# Patient Record
Sex: Male | Born: 1985 | Race: White | Hispanic: No | Marital: Single | Smoking: Current every day smoker
Health system: Southern US, Community
[De-identification: ages and names within clinical notes are randomized; demographics above are authoritative.]

## PROBLEM LIST (undated history)

## (undated) DIAGNOSIS — F419 Anxiety disorder, unspecified: Secondary | ICD-10-CM

## (undated) DIAGNOSIS — I959 Hypotension, unspecified: Secondary | ICD-10-CM

## (undated) DIAGNOSIS — G43909 Migraine, unspecified, not intractable, without status migrainosus: Secondary | ICD-10-CM

## (undated) HISTORY — PX: ABDOMINAL SURGERY: SHX537

---

## 2009-01-30 ENCOUNTER — Ambulatory Visit: Payer: Self-pay | Admitting: Diagnostic Radiology

## 2009-01-30 ENCOUNTER — Emergency Department (HOSPITAL_BASED_OUTPATIENT_CLINIC_OR_DEPARTMENT_OTHER): Admission: EM | Admit: 2009-01-30 | Discharge: 2009-01-30 | Payer: Self-pay | Admitting: Emergency Medicine

## 2010-12-21 ENCOUNTER — Emergency Department (HOSPITAL_BASED_OUTPATIENT_CLINIC_OR_DEPARTMENT_OTHER)
Admission: EM | Admit: 2010-12-21 | Discharge: 2010-12-21 | Disposition: A | Discharge status: Home / Self Care | Payer: Self-pay | Attending: Emergency Medicine | Admitting: Emergency Medicine

## 2010-12-21 ENCOUNTER — Emergency Department (HOSPITAL_BASED_OUTPATIENT_CLINIC_OR_DEPARTMENT_OTHER): Payer: Self-pay

## 2010-12-21 ENCOUNTER — Emergency Department (INDEPENDENT_AMBULATORY_CARE_PROVIDER_SITE_OTHER): Payer: Self-pay

## 2010-12-21 DIAGNOSIS — S022XXA Fracture of nasal bones, initial encounter for closed fracture: Secondary | ICD-10-CM

## 2010-12-21 DIAGNOSIS — H5789 Other specified disorders of eye and adnexa: Secondary | ICD-10-CM | POA: Insufficient documentation

## 2010-12-21 DIAGNOSIS — J3489 Other specified disorders of nose and nasal sinuses: Secondary | ICD-10-CM

## 2010-12-21 DIAGNOSIS — R599 Enlarged lymph nodes, unspecified: Secondary | ICD-10-CM

## 2010-12-21 DIAGNOSIS — R51 Headache: Secondary | ICD-10-CM

## 2010-12-21 MED ORDER — PREDNISONE 10 MG PO TABS: 20.0000 mg | ORAL_TABLET | Freq: Every day | ORAL | Status: AC

## 2010-12-21 MED ORDER — OXYMETAZOLINE HCL 0.05 % NA SOLN: 1.0000 | Freq: Once | NASAL | Status: DC

## 2010-12-21 MED ORDER — OXYMETAZOLINE HCL 0.05 % NA SOLN
NASAL | Status: AC
  Filled 2010-12-21: qty 15

## 2010-12-21 MED ORDER — HYDROMORPHONE HCL 2 MG/ML IJ SOLN
2.0000 mg | Freq: Once | INTRAMUSCULAR | Status: AC
  Administered 2010-12-21: 2 mg via INTRAMUSCULAR

## 2010-12-21 MED ORDER — OXYCODONE-ACETAMINOPHEN 5-325 MG PO TABS: 2.0000 | ORAL_TABLET | ORAL | Status: AC | PRN

## 2010-12-21 MED ORDER — HYDROMORPHONE HCL 2 MG/ML IJ SOLN
INTRAMUSCULAR | Status: AC
  Administered 2010-12-21: 2 mg via INTRAMUSCULAR
  Filled 2010-12-21: qty 1
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
## 2012-04-30 ENCOUNTER — Emergency Department (HOSPITAL_BASED_OUTPATIENT_CLINIC_OR_DEPARTMENT_OTHER)
Admission: EM | Admit: 2012-04-30 | Discharge: 2012-04-30 | Disposition: A | Discharge status: Home / Self Care | Payer: Self-pay | Attending: Emergency Medicine | Admitting: Emergency Medicine

## 2012-04-30 ENCOUNTER — Encounter (HOSPITAL_BASED_OUTPATIENT_CLINIC_OR_DEPARTMENT_OTHER): Payer: Self-pay | Admitting: *Deleted

## 2012-04-30 DIAGNOSIS — Z8669 Personal history of other diseases of the nervous system and sense organs: Secondary | ICD-10-CM | POA: Insufficient documentation

## 2012-04-30 DIAGNOSIS — I959 Hypotension, unspecified: Secondary | ICD-10-CM | POA: Insufficient documentation

## 2012-04-30 DIAGNOSIS — F17200 Nicotine dependence, unspecified, uncomplicated: Secondary | ICD-10-CM | POA: Insufficient documentation

## 2012-04-30 DIAGNOSIS — Z79899 Other long term (current) drug therapy: Secondary | ICD-10-CM | POA: Insufficient documentation

## 2012-04-30 DIAGNOSIS — Z8679 Personal history of other diseases of the circulatory system: Secondary | ICD-10-CM | POA: Insufficient documentation

## 2012-04-30 DIAGNOSIS — K0889 Other specified disorders of teeth and supporting structures: Secondary | ICD-10-CM

## 2012-04-30 DIAGNOSIS — K089 Disorder of teeth and supporting structures, unspecified: Secondary | ICD-10-CM | POA: Insufficient documentation

## 2012-04-30 DIAGNOSIS — R51 Headache: Secondary | ICD-10-CM

## 2012-04-30 HISTORY — DX: Migraine, unspecified, not intractable, without status migrainosus: G43.909

## 2012-04-30 HISTORY — DX: Hypotension, unspecified: I95.9

## 2012-04-30 MED ORDER — MORPHINE SULFATE 4 MG/ML IJ SOLN
8.0000 mg | Freq: Once | INTRAMUSCULAR | Status: AC
  Administered 2012-04-30: 8 mg via INTRAVENOUS
  Filled 2012-04-30: qty 2

## 2012-04-30 MED ORDER — PROMETHAZINE HCL 25 MG/ML IJ SOLN
25.0000 mg | Freq: Once | INTRAMUSCULAR | Status: AC
  Administered 2012-04-30: 25 mg via INTRAMUSCULAR
  Filled 2012-04-30: qty 1

## 2012-04-30 MED ORDER — PENICILLIN V POTASSIUM 500 MG PO TABS: 500.0000 mg | ORAL_TABLET | Freq: Three times a day (TID) | ORAL | Status: DC

## 2012-04-30 MED ORDER — MORPHINE SULFATE 10 MG/ML IJ SOLN: 10.0000 mg | Freq: Once | INTRAMUSCULAR | Status: DC

## 2012-04-30 NOTE — ED Notes (Signed)
Pt states that he now feels well enough to go home and wishes to be discharged at this time.  Provider notified.

## 2012-04-30 NOTE — ED Notes (Addendum)
Migraine x 3 days. Pupils 2 mm.
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
## 2016-05-25 ENCOUNTER — Encounter (HOSPITAL_COMMUNITY): Payer: Self-pay

## 2016-05-25 ENCOUNTER — Emergency Department (HOSPITAL_COMMUNITY): Payer: Self-pay

## 2016-05-25 ENCOUNTER — Emergency Department (HOSPITAL_COMMUNITY)
Admission: EM | Admit: 2016-05-25 | Discharge: 2016-05-25 | Disposition: A | Discharge status: Home / Self Care | Payer: Self-pay | Attending: Emergency Medicine | Admitting: Emergency Medicine

## 2016-05-25 DIAGNOSIS — F1910 Other psychoactive substance abuse, uncomplicated: Secondary | ICD-10-CM | POA: Insufficient documentation

## 2016-05-25 DIAGNOSIS — Z79899 Other long term (current) drug therapy: Secondary | ICD-10-CM | POA: Insufficient documentation

## 2016-05-25 DIAGNOSIS — Z7982 Long term (current) use of aspirin: Secondary | ICD-10-CM | POA: Insufficient documentation

## 2016-05-25 DIAGNOSIS — F17200 Nicotine dependence, unspecified, uncomplicated: Secondary | ICD-10-CM | POA: Insufficient documentation

## 2016-05-25 HISTORY — DX: Anxiety disorder, unspecified: F41.9

## 2016-05-25 LAB — CBC
HEMATOCRIT: 48.2 % (ref 39.0–52.0)
HEMOGLOBIN: 16.6 g/dL (ref 13.0–17.0)
MCH: 29.6 pg (ref 26.0–34.0)
MCHC: 34.4 g/dL (ref 30.0–36.0)
MCV: 85.9 fL (ref 78.0–100.0)
Platelets: 390 10*3/uL (ref 150–400)
RBC: 5.61 MIL/uL (ref 4.22–5.81)
RDW: 13.4 % (ref 11.5–15.5)
WBC: 11.8 10*3/uL — AB (ref 4.0–10.5)

## 2016-05-25 LAB — BASIC METABOLIC PANEL
ANION GAP: 15 (ref 5–15)
BUN: 17 mg/dL (ref 6–20)
CO2: 22 mmol/L (ref 22–32)
Calcium: 10.3 mg/dL (ref 8.9–10.3)
Chloride: 101 mmol/L (ref 101–111)
Creatinine, Ser: 1 mg/dL (ref 0.61–1.24)
Glucose, Bld: 112 mg/dL — ABNORMAL HIGH (ref 65–99)
POTASSIUM: 3.8 mmol/L (ref 3.5–5.1)
SODIUM: 138 mmol/L (ref 135–145)

## 2016-05-25 LAB — I-STAT TROPONIN, ED: Troponin i, poc: 0 ng/mL (ref 0.00–0.08)

## 2016-05-25 LAB — TROPONIN I

## 2016-05-25 LAB — CK: CK TOTAL: 656 U/L — AB (ref 49–397)

## 2016-05-25 MED ORDER — LORAZEPAM 2 MG/ML IJ SOLN
2.0000 mg | Freq: Once | INTRAMUSCULAR | Status: AC
  Administered 2016-05-25: 2 mg via INTRAVENOUS
  Filled 2016-05-25: qty 1

## 2016-05-25 MED ORDER — SODIUM CHLORIDE 0.9 % IV BOLUS (SEPSIS)
1000.0000 mL | Freq: Once | INTRAVENOUS | Status: AC
  Administered 2016-05-25: 1000 mL via INTRAVENOUS

## 2016-05-25 NOTE — ED Notes (Signed)
Patient is in the room got patient into gown waiting on provider

## 2016-05-25 NOTE — ED Notes (Signed)
EDP at bedside  

## 2016-05-25 NOTE — ED Provider Notes (Signed)
[facility]-EMERGENCY DEPT Provider Note   CSN: [id number] Arrival date & time: 05/25/16  1240     History   Chief Complaint Chief Complaint  Patient presents with  . shortness of breath/ skin tingling    HPI Ronnie Moran is a 31 y.o. male.  HPI 31 year old male presents after [facility]. Patient states that earlier today he used speed and injected it into the "wrong place". He states that since then he developed some trouble breathing and felt anxious. He thinks that his feet look different and his friend states that he was flushed. Patient states that at the outside hospital he had his medical screening exam done and was discharged and told to follow-up with a "cardiology therapist." He states he is concerned he has something "really going on" and that he will be able to see the specialist in 30 days. He states he was in his usual state of health prior to taking he speed. He took his Ativan and Wellbutrin prior to arrival but his symptoms continue. He denies any fevers. Does say he has a mild headache. History is limited as patient is rambling and appears intoxicated.  Past Medical History:  Diagnosis Date  . Anxiety   . Hypotension   . Migraines     There are no active problems to display for this patient.   Past Surgical History:  Procedure Laterality Date  . ABDOMINAL SURGERY         Home Medications    Prior to Admission medications   Medication Sig Start Date End Date Taking? Authorizing Provider  Cyanocobalamin (VITAMIN B 12 PO) Take 1 tablet by mouth daily.   Yes Historical Provider, MD  LORazepam (ATIVAN) 1 MG tablet Take 1 tablet by mouth every 6 (six) hours as needed for anxiety.   Yes Historical Provider, MD  Multiple Vitamins-Minerals (MULTIVITAMIN WITH MINERALS) tablet Take 1 tablet by mouth daily.   Yes Historical Provider, MD  Safflower Oil (TONALIN CLA) 1000 MG CAPS Take 1,000 mg by mouth daily.   Yes Historical  Provider, MD  vitamin E (VITAMIN E) 200 UNIT capsule Take 200 Units by mouth daily.   Yes Historical Provider, MD  ALPRAZolam Prudy Feeler(XANAX) 1 MG tablet Take 1 mg by mouth 3 (three) times daily.     Historical Provider, MD  aspirin-acetaminophen-caffeine (EXCEDRIN MIGRAINE) [phone] MG per tablet Take 2 tablets by mouth every 6 (six) hours as needed. pain     Historical Provider, MD  oxyCODONE-acetaminophen (PERCOCET) 5-325 MG per tablet Take 1 tablet by mouth every 4 (four) hours as needed. pain     Historical Provider, MD  penicillin v potassium (VEETID) 500 MG tablet Take 1 tablet (500 mg total) by mouth 3 (three) times daily. Patient not taking: Reported on 05/25/2016 04/30/12   Geoffery Lyonsouglas Delo, MD    Family History No family history on file.  Social History Social History  Substance Use Topics  . Smoking status: Current Every Day Smoker  . Smokeless tobacco: Never Used  . Alcohol use No     Allergies   Ceclor [cefaclor]   Review of Systems Review of Systems  Unable to perform ROS: Mental status change  Constitutional: Negative for fever.  Allergic/Immunologic: Negative for immunocompromised state.     Physical Exam Updated Vital Signs BP (!) 138/104   Pulse 98   Temp 98.4 F (36.9 C)   Resp 16   SpO2 100%   Physical Exam  Constitutional: He is oriented to  person, place, and time. He appears well-developed and well-nourished. No distress.  HENT:  Head: Normocephalic and atraumatic.  Left Ear: External ear normal.  Eyes: Conjunctivae are normal. Pupils are equal, round, and reactive to light. Right eye exhibits no discharge. Left eye exhibits no discharge.  Neck: Normal range of motion. Neck supple.  Cardiovascular: Regular rhythm and intact distal pulses.  Exam reveals no gallop and no friction rub.   No murmur heard. Tachy to 140s  Pulmonary/Chest: Effort normal and breath sounds normal. No respiratory distress.  Abdominal: Soft. Bowel sounds are normal. He exhibits no  distension and no mass. There is no tenderness. There is no rebound and no guarding.  Musculoskeletal: Normal range of motion. He exhibits no edema, tenderness or deformity.  Neurological: He is alert and oriented to person, place, and time. No cranial nerve deficit.  Skin: Skin is warm. He is not diaphoretic.  Psychiatric:  Appears intoxicated, rambling, states his hands and feet look red but they do not     ED Treatments / Results  Labs (all labs ordered are listed, but only abnormal results are displayed) Labs Reviewed  BASIC METABOLIC PANEL - Abnormal; Notable for the following:       Result Value   Glucose, Bld 112 (*)    All other components within normal limits  CBC - Abnormal; Notable for the following:    WBC 11.8 (*)    All other components within normal limits  CK - Abnormal; Notable for the following:    Total CK 656 (*)    All other components within normal limits  TROPONIN I  I-STAT TROPOININ, ED    EKG  EKG Interpretation None       Radiology Dg Chest 2 View  Result Date: 05/25/2016 CLINICAL DATA:  Shortness of breath and nausea over the last day. Smoking history. EXAM: CHEST  2 VIEW COMPARISON:  None. FINDINGS: Heart size is normal. Mediastinal shadows are normal. The lungs are clear except for mild linear scarring in the anterior right upper lobe. No infiltrate, collapse or effusion. Bony structures are unremarkable. IMPRESSION: No active disease. Mild linear scarring in the anterior right upper lobe. Electronically Signed   By: Paulina Fusi M.D.   On: 05/25/2016 13:22    Procedures Procedures (including critical care time)  Medications Ordered in ED Medications  sodium chloride 0.9 % bolus 1,000 mL (0 mLs Intravenous Stopped 05/25/16 1658)  LORazepam (ATIVAN) injection 2 mg (2 mg Intravenous Given 05/25/16 1524)     Initial Impression / Assessment and Plan / ED Course  I have reviewed the triage vital signs and the nursing notes.  Pertinent labs &  imaging results that were available during my care of the patient were reviewed by me and considered in my medical decision making (see chart for details).     Likely drug-related intoxication. Patient exam is reassuring. No red signs on exam or on history for life-threatening emergency.  Labs reviewed - CK slightly increased but s/p fluids, not significantly elevated to cause rhabdo at this time and pt feeling better, Cr wnls. Mild leukocytosis but AF - do not suspect bacteremia but will need to fu closely. CXR negative. IV site does not appear infected. Pt advised he must closely fu with pcp and seek care for addiction. Strict return precautions. EKG with nonspecific changes from demand ischemia, trop neg. VS improved after fluids/ativan. No SI/HI.  Final Clinical Impressions(s) / ED Diagnoses   Final diagnoses:  Drug abuse  New Prescriptions Discharge Medication List as of 05/25/2016  4:58 PM       Sidney Ace, MD 05/25/16 2130    Doug Sou, MD 05/26/16 [id number]

## 2016-05-25 NOTE — ED Triage Notes (Signed)
Patient here with tachycardia and shortness of breath for 12 hours. States that he is having tingling in extremities, states that he injected speed several hours ago. Alert and oriented. No chest pain, appears anxious. Took his ativan and wellbutrin pta

## 2016-05-25 NOTE — ED Notes (Signed)
Attempted to add on CK lab. Main lab not picking up. Will attempt again.
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
## 2016-05-25 NOTE — ED Provider Notes (Signed)
MSE was initiated and I personally evaluated the patient and placed orders (if any) at  3:07 PM on May 25, 2016.  Patient presents to the ED with a chief complaint of methamphetamine intoxication.  Used meth about 3 hours ago.  Reports racing heart and tingling/hypersenstivity of skin all over.    Will monitor, give fluids, and ativan.  Labs are reassuring.  PE: Gen: A&O x4 HEENT: PERRL, EOM intact CHEST: tachycardic, no m/r/g LUNGS: CTAB, no w/r/r ABD: BS x 4, ND/NT EXT: No edema, strong peripheral pulses NEURO: Sensation and strength intact bilaterally SKIN: warm and dry, no rash or cellulitis, no apparent infection of injection site.   The patient appears stable so that the remainder of the MSE may be completed by another provider.   Roxy Horsemanobert Taggart Prasad, PA-C 05/25/16 1511    Azalia BilisKevin Campos, MD 05/26/16 [phone]

## 2016-09-08 ENCOUNTER — Other Ambulatory Visit: Payer: Self-pay | Admitting: Pharmacist Clinician (PhC)/ Clinical Pharmacy Specialist

## 2016-09-08 ENCOUNTER — Ambulatory Visit (INDEPENDENT_AMBULATORY_CARE_PROVIDER_SITE_OTHER): Payer: Self-pay | Admitting: Internal Medicine

## 2016-09-08 ENCOUNTER — Encounter: Payer: Self-pay | Admitting: Internal Medicine

## 2016-09-08 DIAGNOSIS — B182 Chronic viral hepatitis C: Secondary | ICD-10-CM

## 2016-09-08 MED ORDER — SOFOSBUVIR-VELPATASVIR 400-100 MG PO TABS: 1.0000 | ORAL_TABLET | Freq: Every day | ORAL | 2 refills | Status: DC

## 2016-09-08 NOTE — Progress Notes (Unsigned)
HPI: Ronnie Moran is a 31 y.o. male who is here for an evaluation of his hep C.   No results found for: HCVGENOTYPE, HEPCGENOTYPE  Allergies: Allergies  Allergen Reactions  . Ceclor [Cefaclor] Rash    Vitals: Temp: 98 F (36.7 C) (05/30 1005) Temp Source: Oral (05/30 1005) BP: 131/84 (05/30 1005) Pulse Rate: 74 (05/30 1005)  Past Medical History: Past Medical History:  Diagnosis Date  . Anxiety   . Hypotension   . Migraines     Social History: Social History   Social History  . Marital status: Single    Spouse name: N/A  . Number of children: N/A  . Years of education: N/A   Social History Main Topics  . Smoking status: Current Every Day Smoker    Packs/day: 0.50    Types: Cigarettes    Start date: 04/12/1997  . Smokeless tobacco: Never Used  . Alcohol use No  . Drug use: Yes    Types: Marijuana     Comment: socially  . Sexual activity: No   Other Topics Concern  . Not on file   Social History Narrative  . No narrative on file    Labs: No results found for: HIV1RNAQUANT, HIV1RNAVL, CD4TABS, HEPBSAB, HEPBSAG, HCVAB  No results found for: HCVGENOTYPE, HEPCGENOTYPE  No flowsheet data found.  No results found for: AST, ALT, INR  CrCl: CrCl cannot be calculated (Patient's most recent lab result is older than the maximum 21 days allowed.).  Fibrosis Score: None  Child-Pugh Score: Prob A  Previous Treatment Regimen: None  Assessment: Ronnie Moran is here for his hep C evaluation. 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 He [place] positive [place]. In the process of doing patient assistance for Epclusa, we talked to him about the [facility] study coming later this month. We brought Kim in to talk to him about the study. He would be a great study candidate because he would be a very adherent candidate. His meds and labs will be covered and compensation. Kim believe that it'll probably launch at the end of  June or July. He is willing to wait for the study. Patient assistance is always going to be an option for him.   Recommendations:  Epclusa x3 month either through [employer] or [employer] study  Norris CityMinh Ryen Heitmeyer, [place].D., BCPS, AAHIVP Clinical Infectious Disease Pharmacist [facility] 09/08/2016, 11:29 AM

## 2016-09-08 NOTE — Progress Notes (Signed)
[facility]   CC: consideration for treatment for chronic hepatitis C  HPI:  +Ronnie Moran is a 31 y.o. male who presents for initial evaluation and management of chronic hepatitis C.  Patient tested positive last year. Hepatitis C-associated risk factors present are: history of blood transfusion (details: possible transfusion around 1990 during surgery), sexual contact with person with liver disease (details: previous partner may have been positive). Patient denies IV drug abuse, renal dialysis. Patient has had other studies performed. Results: hepatitis C RNA by PCR, result: positive. Patient has not had prior treatment for Hepatitis C. Patient does not have a past history of liver disease. Patient does not have a family history of liver disease. Patient does not  have associated signs or symptoms related to liver disease.  Labs reviewed and confirm chronic hepatitis C with a positive viral load.  Also I have reviewed his previous record from [facility] and he has genotype 2, hepatitis B surface Ab negative, HIV negative.  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  He also is interested in PrEP and did get prescribed by his PCP but cannot afford the medication.       Patient does not have documented immunity to Hepatitis A. Patient does not have documented immunity to Hepatitis B.    Review of Systems:  Constitutional: negative for fatigue and malaise Gastrointestinal: negative for diarrhea Integument/breast: negative for rash Musculoskeletal: negative for myalgias and arthralgias All other systems reviewed and are negative       Past Medical History:  Diagnosis Date  . Anxiety   . Hypotension   . Migraines     Prior to Admission medications   Medication Sig Start Date End Date Taking? Authorizing Provider  aspirin-acetaminophen-caffeine (EXCEDRIN MIGRAINE) [phone] MG per  tablet Take 2 tablets by mouth every 6 (six) hours as needed. pain    Yes [provider]  clonazePAM (KLONOPIN) 1 MG tablet Take 1 mg by mouth 2 (two) times daily.   Yes [provider]  Cyanocobalamin (VITAMIN B 12 PO) Take 1 tablet by mouth daily.   Yes [provider]  gabapentin (NEURONTIN) 300 MG capsule Take 600 mg by mouth 2 (two) times daily.   Yes [provider]  ALPRAZolam Prudy Feeler) 1 MG tablet Take 1 mg by mouth 3 (three) times daily.     [provider]  Multiple Vitamins-Minerals (MULTIVITAMIN WITH MINERALS) tablet Take 1 tablet by mouth daily.    [provider]  Sofosbuvir-Velpatasvir (EPCLUSA) 400-100 MG TABS Take 1 tablet by mouth daily. 09/08/16   Gardiner Barefoot, MD  vitamin E (VITAMIN E) 200 UNIT capsule Take 200 Units by mouth daily.    [provider]    Allergies  Allergen Reactions  . Ceclor [Cefaclor] Rash    Social History  Substance Use Topics  . Smoking status: Current Every Day Smoker    Packs/day: 0.50    Types: Cigarettes    Start date: 04/12/1997  . Smokeless tobacco: Never Used  . Alcohol use No    FMH: no renal disease, no cirrhosis or liver cancer   Objective:  Constitutional: in no apparent distress,  Vitals:   09/08/16 1005  BP: 131/84  Pulse: 74  Temp: 98 F (36.7 C)   Eyes: anicteric Cardiovascular: Cor RRR Respiratory: CTA B; normal respiratory effort Gastrointestinal: Bowel sounds are normal, liver is not enlarged,  spleen is not enlarged Musculoskeletal: no pedal edema noted Skin: negatives: no rash; no porphyria cutanea tarda Lymphatic: no cervical lymphadenopathy   Laboratory Genotype: No results found for: HCVGENOTYPE HCV viral load: No results found for: HCVQUANT Lab Results  Component Value Date   WBC 11.8 (H) 05/25/2016   HGB 16.6 05/25/2016   HCT 48.2 05/25/2016   MCV 85.9 05/25/2016   PLT 390 05/25/2016    Lab Results  Component Value Date   CREATININE  1.00 05/25/2016   BUN 17 05/25/2016   NA 138 05/25/2016   K 3.8 05/25/2016   CL 101 05/25/2016   CO2 22 05/25/2016   No results found for: ALT, AST, GGT, ALKPHOS   Labs and history reviewed and show CHILD-PUGH unknown  5-6 points: Child class A 7-9 points: Child class B 10-15 points: Child class C  No results found for: INR, BILITOT, ALBUMIN   Assessment: New Patient with Chronic Hepatitis C genotype unknown, untreated.  I discussed with the patient the lab findings that confirm chronic hepatitis C as well as the natural history and progression of disease including about 30% of people who develop cirrhosis of the liver if left untreated and once cirrhosis is established there is a 2-7% risk per year of liver cancer and liver failure.  I discussed the importance of treatment and benefits in reducing the risk, even if significant liver fibrosis exists.   Plan: 1) Patient counseled extensively on limiting acetaminophen to no more than 2 grams daily, avoidance of alcohol. 2) Transmission discussed with patient including sexual transmission, sharing razors and toothbrush.   3) Will need referral to gastroenterology if concern for cirrhosis 4) Will need referral for substance abuse counseling: No.; Further work up to include urine drug screen  No. 5) Will prescribe appropriate medication based on genotype and coverage  6) Hepatitis A and B vaccine but will defer for now to limit costs. 7) Pneumovax vaccine at some point  We discussed the options for treatment and he will consider our hepatitis C study that is upcoming, though no start date yet available.  If it is prolonged, will just treat him outside of the study and limit labs.  Medication will be Epclusa through [employer].  For PrEP, he was seen by our pharmacist and we can provide the medication through patient assistance and will consider this as well.

## 2016-09-09 NOTE — Addendum Note (Signed)
Addended by: Mariea ClontsGREEN, Maniya Donovan D on: 09/09/2016 10:38 AM   Modules accepted: Orders

## 2016-09-13 ENCOUNTER — Telehealth: Payer: Self-pay | Admitting: *Deleted

## 2016-09-13 NOTE — Telephone Encounter (Signed)
Patient called with questions about Hep C meds. He wanted to know if we had heard anything of the hep C study. Spoke to BJ's WholesaleKim Epperson and she said that she already explained to patient that it would be some time before we know about study. Spoke to EcolabBetty, pharm tech, and she said she also has spoken to the patient in great length regarding hep C meds. She said that his meds are approved if he does not want to wait for the study. Kathie RhodesBetty will call him back later this afternoon.

## 2016-09-15 NOTE — Telephone Encounter (Signed)
I called him back that day.  He was wanting to know if the study was approved.  I reminded him that it is estimated to be approved, maybe, the end of July.  He was satisfied with that and wants to wait.

## 2016-09-29 ENCOUNTER — Telehealth: Payer: Self-pay | Admitting: *Deleted

## 2016-09-29 NOTE — Telephone Encounter (Signed)
Patient has decided that he would rather start treatment now instead of waiting for the study to start. Will route to pharmacy and Dr Luciana Axeomer. Andree CossHowell, Ludger Bones M, RN

## 2016-09-30 ENCOUNTER — Other Ambulatory Visit: Payer: Self-pay | Admitting: Internal Medicine

## 2016-09-30 DIAGNOSIS — B182 Chronic viral hepatitis C: Secondary | ICD-10-CM

## 2016-09-30 NOTE — Telephone Encounter (Signed)
Ok, I put in for the labs he will need and needs elastography.  Then can get him on treatment. thanks

## 2016-10-05 NOTE — Telephone Encounter (Signed)
Kathie RhodesBetty will reach out to the patient for further instructions. Will 'cc Annice PihJackie as well.

## 2016-10-07 NOTE — Telephone Encounter (Signed)
I spoke extensively with Mr. Hancock-Lathrop.  He has decided he wants to be a part of the [place] study.  I will call [employer] and cancel his application.

## 2016-10-18 ENCOUNTER — Encounter (HOSPITAL_COMMUNITY): Payer: Self-pay | Admitting: Emergency Medicine

## 2016-10-18 ENCOUNTER — Emergency Department (HOSPITAL_COMMUNITY)
Admission: EM | Admit: 2016-10-18 | Discharge: 2016-10-19 | Disposition: A | Discharge status: Home / Self Care | Payer: Self-pay | Attending: Emergency Medicine | Admitting: Emergency Medicine

## 2016-10-18 DIAGNOSIS — R45851 Suicidal ideations: Secondary | ICD-10-CM | POA: Insufficient documentation

## 2016-10-18 DIAGNOSIS — Z046 Encounter for general psychiatric examination, requested by authority: Secondary | ICD-10-CM | POA: Insufficient documentation

## 2016-10-18 DIAGNOSIS — Z79899 Other long term (current) drug therapy: Secondary | ICD-10-CM | POA: Insufficient documentation

## 2016-10-18 DIAGNOSIS — F329 Major depressive disorder, single episode, unspecified: Secondary | ICD-10-CM | POA: Insufficient documentation

## 2016-10-18 DIAGNOSIS — F332 Major depressive disorder, recurrent severe without psychotic features: Secondary | ICD-10-CM | POA: Diagnosis present

## 2016-10-18 DIAGNOSIS — F17210 Nicotine dependence, cigarettes, uncomplicated: Secondary | ICD-10-CM | POA: Insufficient documentation

## 2016-10-18 LAB — COMPREHENSIVE METABOLIC PANEL
ALBUMIN: 4.2 g/dL (ref 3.5–5.0)
ALT: 58 U/L (ref 17–63)
ANION GAP: 8 (ref 5–15)
AST: 31 U/L (ref 15–41)
Alkaline Phosphatase: 54 U/L (ref 38–126)
BILIRUBIN TOTAL: 0.3 mg/dL (ref 0.3–1.2)
BUN: 15 mg/dL (ref 6–20)
CHLORIDE: 108 mmol/L (ref 101–111)
CO2: 25 mmol/L (ref 22–32)
Calcium: 9.2 mg/dL (ref 8.9–10.3)
Creatinine, Ser: 0.74 mg/dL (ref 0.61–1.24)
GFR calc Af Amer: >60 mL/min (ref >60)
GFR calc non Af Amer: >60 mL/min (ref >60)
GLUCOSE: 103 mg/dL — AB (ref 65–99)
POTASSIUM: 4.1 mmol/L (ref 3.5–5.1)
Sodium: 141 mmol/L (ref 135–145)
TOTAL PROTEIN: 7.4 g/dL (ref 6.5–8.1)

## 2016-10-18 LAB — RAPID URINE DRUG SCREEN, HOSP PERFORMED
AMPHETAMINES: NOT DETECTED
BARBITURATES: NOT DETECTED
BENZODIAZEPINES: POSITIVE — AB
Cocaine: NOT DETECTED
Opiates: NOT DETECTED
Tetrahydrocannabinol: NOT DETECTED

## 2016-10-18 LAB — CBC
HEMATOCRIT: 41.7 % (ref 39.0–52.0)
HEMOGLOBIN: 14.4 g/dL (ref 13.0–17.0)
MCH: 30 pg (ref 26.0–34.0)
MCHC: 34.5 g/dL (ref 30.0–36.0)
MCV: 86.9 fL (ref 78.0–100.0)
Platelets: 419 10*3/uL — ABNORMAL HIGH (ref 150–400)
RBC: 4.8 MIL/uL (ref 4.22–5.81)
RDW: 12.7 % (ref 11.5–15.5)
WBC: 10 10*3/uL (ref 4.0–10.5)

## 2016-10-18 LAB — ACETAMINOPHEN LEVEL

## 2016-10-18 LAB — SALICYLATE LEVEL: Salicylate Lvl: <7 mg/dL (ref 2.8–30.0)

## 2016-10-18 LAB — ETHANOL: Alcohol, Ethyl (B): <5 mg/dL (ref <5)

## 2016-10-18 MED ORDER — GABAPENTIN 300 MG PO CAPS
600.0000 mg | ORAL_CAPSULE | Freq: Two times a day (BID) | ORAL | Status: DC
  Administered 2016-10-18 – 2016-10-19 (×2): 600 mg via ORAL
  Filled 2016-10-18 (×2): qty 2

## 2016-10-18 MED ORDER — ONDANSETRON HCL 4 MG PO TABS: 4.0000 mg | ORAL_TABLET | Freq: Three times a day (TID) | ORAL | Status: DC | PRN

## 2016-10-18 MED ORDER — CLINDAMYCIN HCL 300 MG PO CAPS
300.0000 mg | ORAL_CAPSULE | Freq: Three times a day (TID) | ORAL | Status: DC
  Administered 2016-10-18 – 2016-10-19 (×2): 300 mg via ORAL
  Filled 2016-10-18 (×3): qty 1

## 2016-10-18 MED ORDER — IBUPROFEN 200 MG PO TABS: 600.0000 mg | ORAL_TABLET | Freq: Three times a day (TID) | ORAL | Status: DC | PRN

## 2016-10-18 MED ORDER — ALUM & MAG HYDROXIDE-SIMETH 200-200-20 MG/5ML PO SUSP: 30.0000 mL | Freq: Four times a day (QID) | ORAL | Status: DC | PRN

## 2016-10-18 MED ORDER — ZOLPIDEM TARTRATE 5 MG PO TABS
5.0000 mg | ORAL_TABLET | Freq: Every evening | ORAL | Status: DC | PRN
  Administered 2016-10-18: 5 mg via ORAL
  Filled 2016-10-18 (×2): qty 1

## 2016-10-18 MED ORDER — NICOTINE 21 MG/24HR TD PT24
21.0000 mg | MEDICATED_PATCH | Freq: Every day | TRANSDERMAL | Status: DC
  Administered 2016-10-19: 21 mg via TRANSDERMAL
  Filled 2016-10-18: qty 1

## 2016-10-18 MED ORDER — CLONAZEPAM 1 MG PO TABS
1.0000 mg | ORAL_TABLET | Freq: Two times a day (BID) | ORAL | Status: DC
  Administered 2016-10-18 – 2016-10-19 (×2): 1 mg via ORAL
  Filled 2016-10-18 (×2): qty 1

## 2016-10-18 NOTE — ED Notes (Signed)
Patient arrived to unit and is pleasant and cooperative during assessment. Patient offered snacks and soda, which he declined at this time. Patient verbally contracts for safety; no signs of distress noted.

## 2016-10-18 NOTE — ED Provider Notes (Signed)
[facility]-EMERGENCY DEPT Provider Note   CSN: [id number] Arrival date & time: 10/18/16  1918     History   Chief Complaint Chief Complaint  Patient presents with  . Suicidal    HPI Ronnie Moran is a 31 y.o. male.  Pt presents to the ED today feeling suicidal.  The pt said that he is tired of everything and wants to be dead.  He said he has a plan, but does not want to say what it is.  He has been picking at his skin and has some sores to his chest.  He has been on abx for that.  [facility]  He said he was kept for a few days, then sent home.        Past Medical History:  Diagnosis Date  . Anxiety   . Hypotension   . Migraines     Patient Active Problem List   Diagnosis Date Noted  . Chronic hepatitis C without hepatic coma (HCC) 09/08/2016    Past Surgical History:  Procedure Laterality Date  . ABDOMINAL SURGERY         Home Medications    Prior to Admission medications   Medication Sig Start Date End Date Taking? Authorizing Provider  clindamycin (CLEOCIN) 300 MG capsule Take 300 mg by mouth 3 (three) times daily.   Yes [provider]  clonazePAM (KLONOPIN) 1 MG tablet Take 1 mg by mouth 2 (two) times daily.   Yes [provider]  gabapentin (NEURONTIN) 300 MG capsule Take 600 mg by mouth 2 (two) times daily.   Yes [provider]  aspirin-acetaminophen-caffeine (EXCEDRIN MIGRAINE) [phone] MG per tablet Take 2 tablets by mouth every 6 (six) hours as needed. pain     [provider]  Sofosbuvir-Velpatasvir (EPCLUSA) 400-100 MG TABS Take 1 tablet by mouth daily. Patient not taking: Reported on 10/18/2016 09/08/16   Gardiner Barefootomer, Robert W, MD    Family History Family History  Problem Relation Age of Onset  . CAD Other     Social History Social History  Substance Use Topics  . Smoking status: Current Every Day Smoker    Packs/day: 0.50    Types: Cigarettes    Start date: 04/12/1997   . Smokeless tobacco: Never Used  . Alcohol use No     Allergies   Ceclor [cefaclor]   Review of Systems Review of Systems  Psychiatric/Behavioral: Positive for suicidal ideas.  All other systems reviewed and are negative.    Physical Exam Updated Vital Signs BP (!) 104/92 (BP Location: Left Arm)   Pulse 79   Temp 97.8 F (36.6 C) (Oral)   Resp 18   SpO2 100%   Physical Exam  Constitutional: He is oriented to person, place, and time. He appears well-developed and well-nourished.  HENT:  Head: Normocephalic and atraumatic.  Right Ear: External ear normal.  Left Ear: External ear normal.  Nose: Nose normal.  Mouth/Throat: Oropharynx is clear and moist.  Eyes: Conjunctivae and EOM are normal. Pupils are equal, round, and reactive to light.  Neck: Normal range of motion. Neck supple.  Cardiovascular: Normal rate, regular rhythm, normal heart sounds and intact distal pulses.   Pulmonary/Chest: Effort normal and breath sounds normal.  Abdominal: Soft. Bowel sounds are normal.  Musculoskeletal: Normal range of motion.  Neurological: He is alert and oriented to person, place, and time.  Skin: Skin is warm.  Multiple healing sores to anterior chest  Psychiatric: He  exhibits a depressed mood.  Nursing note and vitals reviewed.    ED Treatments / Results  Labs (all labs ordered are listed, but only abnormal results are displayed) Labs Reviewed  COMPREHENSIVE METABOLIC PANEL - Abnormal; Notable for the following:       Result Value   Glucose, Bld 103 (*)    All other components within normal limits  ACETAMINOPHEN LEVEL - Abnormal; Notable for the following:    Acetaminophen (Tylenol), Serum <10 (*)    All other components within normal limits  CBC - Abnormal; Notable for the following:    Platelets 419 (*)    All other components within normal limits  RAPID URINE DRUG SCREEN, HOSP PERFORMED - Abnormal; Notable for the following:    Benzodiazepines POSITIVE (*)     All other components within normal limits  ETHANOL  SALICYLATE LEVEL    EKG  EKG Interpretation None       Radiology No results found.  Procedures Procedures (including critical care time)  Medications Ordered in ED Medications  ibuprofen (ADVIL,MOTRIN) tablet 600 mg (not administered)  zolpidem (AMBIEN) tablet 5 mg (not administered)  ondansetron (ZOFRAN) tablet 4 mg (not administered)  alum & mag hydroxide-simeth (MAALOX/MYLANTA) 200-200-20 MG/5ML suspension 30 mL (not administered)  nicotine (NICODERM CQ - dosed in mg/24 hours) patch 21 mg (not administered)  clindamycin (CLEOCIN) capsule 300 mg (not administered)  clonazePAM (KLONOPIN) tablet 1 mg (not administered)  gabapentin (NEURONTIN) capsule 600 mg (not administered)     Initial Impression / Assessment and Plan / ED Course  I have reviewed the triage vital signs and the nursing notes.  Pertinent labs & imaging results that were available during my care of the patient were reviewed by me and considered in my medical decision making (see chart for details).    Consult placed to TTS.  Disposition pending consult.  Pt is medically clear.  Final Clinical Impressions(s) / ED Diagnoses   Final diagnoses:  Suicidal ideation    New Prescriptions New Prescriptions   No medications on file     Jacalyn Lefevre, MD 10/18/16 2203

## 2016-10-18 NOTE — ED Triage Notes (Signed)
Pt states he is tired of everything and states he would rather be dead  Pt states he has a plan but states he would rather not say what it is

## 2016-10-19 ENCOUNTER — Inpatient Hospital Stay (HOSPITAL_COMMUNITY)
Admission: EM | Admit: 2016-10-19 | Discharge: 2016-10-25 | DRG: 897 | Disposition: A | Discharge status: Home / Self Care | Payer: Federal, State, Local not specified - Other | Source: Intra-hospital | Attending: Psychiatry | Admitting: Psychiatry

## 2016-10-19 ENCOUNTER — Encounter (HOSPITAL_COMMUNITY): Payer: Self-pay | Admitting: General Practice

## 2016-10-19 DIAGNOSIS — F4310 Post-traumatic stress disorder, unspecified: Secondary | ICD-10-CM | POA: Diagnosis present

## 2016-10-19 DIAGNOSIS — F1490 Cocaine use, unspecified, uncomplicated: Secondary | ICD-10-CM | POA: Diagnosis not present

## 2016-10-19 DIAGNOSIS — F1514 Other stimulant abuse with stimulant-induced mood disorder: Principal | ICD-10-CM | POA: Diagnosis present

## 2016-10-19 DIAGNOSIS — Z91410 Personal history of adult physical and sexual abuse: Secondary | ICD-10-CM

## 2016-10-19 DIAGNOSIS — F17210 Nicotine dependence, cigarettes, uncomplicated: Secondary | ICD-10-CM | POA: Diagnosis not present

## 2016-10-19 DIAGNOSIS — Z818 Family history of other mental and behavioral disorders: Secondary | ICD-10-CM | POA: Diagnosis not present

## 2016-10-19 DIAGNOSIS — B182 Chronic viral hepatitis C: Secondary | ICD-10-CM | POA: Diagnosis not present

## 2016-10-19 DIAGNOSIS — F332 Major depressive disorder, recurrent severe without psychotic features: Secondary | ICD-10-CM | POA: Diagnosis present

## 2016-10-19 DIAGNOSIS — F429 Obsessive-compulsive disorder, unspecified: Secondary | ICD-10-CM | POA: Diagnosis present

## 2016-10-19 DIAGNOSIS — R45851 Suicidal ideations: Secondary | ICD-10-CM | POA: Diagnosis present

## 2016-10-19 MED ORDER — TRAZODONE HCL 50 MG PO TABS
50.0000 mg | ORAL_TABLET | Freq: Every evening | ORAL | Status: DC | PRN
  Administered 2016-10-20: 50 mg via ORAL
  Filled 2016-10-19: qty 1

## 2016-10-19 MED ORDER — BUPROPION HCL ER (XL) 300 MG PO TB24
450.0000 mg | ORAL_TABLET | Freq: Every day | ORAL | Status: DC
  Administered 2016-10-19 – 2016-10-25 (×7): 450 mg via ORAL
  Filled 2016-10-19 (×11): qty 1

## 2016-10-19 MED ORDER — ACETAMINOPHEN 325 MG PO TABS
650.0000 mg | ORAL_TABLET | Freq: Four times a day (QID) | ORAL | Status: DC | PRN
  Administered 2016-10-24 – 2016-10-25 (×2): 650 mg via ORAL
  Filled 2016-10-19 (×2): qty 2

## 2016-10-19 MED ORDER — BUPROPION HCL ER (XL) 150 MG PO TB24
150.0000 mg | ORAL_TABLET | Freq: Every day | ORAL | Status: DC
  Filled 2016-10-19 (×2): qty 1

## 2016-10-19 MED ORDER — CLINDAMYCIN HCL 300 MG PO CAPS
300.0000 mg | ORAL_CAPSULE | Freq: Three times a day (TID) | ORAL | Status: DC
  Administered 2016-10-19 – 2016-10-25 (×19): 300 mg via ORAL
  Filled 2016-10-19 (×22): qty 1
  Filled 2016-10-19: qty 2
  Filled 2016-10-19: qty 1

## 2016-10-19 MED ORDER — MAGNESIUM HYDROXIDE 400 MG/5ML PO SUSP: 30.0000 mL | Freq: Every day | ORAL | Status: DC | PRN

## 2016-10-19 MED ORDER — CLONAZEPAM 0.5 MG PO TABS
0.5000 mg | ORAL_TABLET | Freq: Two times a day (BID) | ORAL | Status: DC
  Administered 2016-10-19 – 2016-10-20 (×3): 0.5 mg via ORAL
  Filled 2016-10-19 (×3): qty 1

## 2016-10-19 MED ORDER — NICOTINE 21 MG/24HR TD PT24
21.0000 mg | MEDICATED_PATCH | Freq: Every day | TRANSDERMAL | Status: DC
  Administered 2016-10-20 – 2016-10-24 (×5): 21 mg via TRANSDERMAL
  Filled 2016-10-19 (×7): qty 1

## 2016-10-19 MED ORDER — GABAPENTIN 300 MG PO CAPS
600.0000 mg | ORAL_CAPSULE | Freq: Two times a day (BID) | ORAL | Status: DC
  Administered 2016-10-19 – 2016-10-25 (×13): 600 mg via ORAL
  Filled 2016-10-19 (×6): qty 2
  Filled 2016-10-19: qty 28
  Filled 2016-10-19 (×2): qty 2
  Filled 2016-10-19 (×2): qty 28
  Filled 2016-10-19 (×6): qty 2
  Filled 2016-10-19: qty 28
  Filled 2016-10-19 (×3): qty 2

## 2016-10-19 NOTE — Progress Notes (Signed)
Patient ID: Ronnie Moran, male   DOB: May 25, 1985, 31 y.o.   MRN: [id number]  Patient came to the medication window and asked writer what mg of Klonopin he was receiving this evening. Writer informed patient that the order is written as 0.5 mg and will be administered as such. Patient appeared irritable at this point, stating "so I'm going to withdrawal." Writer inquired why patient believed he would experience withdrawal if he was still receiving 0.5 mg. Patient was unable to tell writer his reasoning for believing that he would withdrawal. He reports that his withdrawal symptoms present as anger, mood swings, and sweating. Writer encouraged patient to speak with the provider tomorrow about his medication concerns. Patient is guarded and irritable. He did take the 0.5 mg of Klonopin as well as his other scheduled evening medications and walked away from the medication window.

## 2016-10-19 NOTE — ED Notes (Signed)
Per Bunnie Pionori, AC, [facility]
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
## 2016-10-19 NOTE — Progress Notes (Signed)
Ronnie Moran is a 31 year old male being admitted voluntarily [facility].  He came to the ED for suicidal ideation with plan to walk out in traffic and substance abuse.  He reported heroin and methamphetamine use.  He reported his stressors include continual substance abuse, recent move back to [place] and having unsteady income.  He has a history of hypotension, hepatitis C and migraines.   He is diagnosed with Major Depressive Disorder, Opioid use disorder and Stimulant use disorder.  He currently denies SI/HI or A/V hallucinations.  He denies any pain or discomfort and appears to be in no physical distress.  Oriented him to the unit.  Admission paperwork completed and signed.  Belongings searched and secured in locker #  31.  Skin assessment completed and noted R/L shoulder and left forearm abrasions(healing).  Q 15 minute checks initiated for safety.  We will monitor the progress towards his goals. t

## 2016-10-19 NOTE — Progress Notes (Signed)
Patient attended evening AA meeting 

## 2016-10-19 NOTE — ED Notes (Signed)
[facility] by [facility]. All belongings returned to pt who signed for same. Pt was calm and cooperative.

## 2016-10-19 NOTE — Tx Team (Signed)
Initial Treatment Plan 10/19/2016 3:13 PM Ronnie Moran [id number]    PATIENT STRESSORS: Financial difficulties Substance abuse   PATIENT STRENGTHS: Wellsite geologistCommunication skills General fund of knowledge Physical Health   PATIENT IDENTIFIED PROBLEMS: Depression  Substance abuse  Suicidal ideation  "I need tools to stay clean"  "Clear my head"             DISCHARGE CRITERIA:  Improved stabilization in mood, thinking, and/or behavior Verbal commitment to aftercare and medication compliance Withdrawal symptoms are absent or subacute and managed without 24-hour nursing intervention   PRELIMINARY DISCHARGE PLAN: Participate in family therapy Medication management  PATIENT/FAMILY INVOLVEMENT: This treatment plan has been presented to and reviewed with the patient, Ronnie Moran.  The patient and family have been given the opportunity to ask questions and make suggestions.  Levin BaconHeather V Nymir Ringler, RN 10/19/2016, 3:13 PM

## 2016-10-19 NOTE — BH Assessment (Signed)
[facility] Assessment Progress Note  Per Thedore MinsMojeed Akintayo, MD, this pt requires psychiatric hospitalization at this time.  Berneice Heinrichina Tate, RN, [facility].  Pt signed Voluntary Admission and Consent for Treatment last night.  Pt's nurse, Diane, has been notified, and agrees to send original paperwork along with pt via [facility], and to call report to [phone].  Doylene Canninghomas Aleenah Homen, MA Triage Specialist [phone]

## 2016-10-19 NOTE — Progress Notes (Signed)
D   Pt was demanding more medications and couldn't understand why he could not have more clonipin    He said if I dont get more medication my bipolar is going to come out    He insinuated he would do something destructive   It sounded like a threat    A   Offered medications pt could have and verbal support and encouraged him to talk with the doctor in the AM  R   Patient is safe at present time

## 2016-10-19 NOTE — ED Notes (Signed)
Called [facility] and was sent to voicemail.

## 2016-10-20 ENCOUNTER — Encounter (HOSPITAL_COMMUNITY): Payer: Self-pay | Admitting: Behavioral Health

## 2016-10-20 DIAGNOSIS — R45851 Suicidal ideations: Secondary | ICD-10-CM

## 2016-10-20 DIAGNOSIS — Z818 Family history of other mental and behavioral disorders: Secondary | ICD-10-CM

## 2016-10-20 DIAGNOSIS — B182 Chronic viral hepatitis C: Secondary | ICD-10-CM

## 2016-10-20 MED ORDER — TRAZODONE HCL 50 MG PO TABS
50.0000 mg | ORAL_TABLET | Freq: Every evening | ORAL | Status: DC | PRN
  Administered 2016-10-20 – 2016-10-22 (×5): 50 mg via ORAL
  Filled 2016-10-20 (×11): qty 1

## 2016-10-20 MED ORDER — CLONAZEPAM 0.5 MG PO TABS: 0.5000 mg | ORAL_TABLET | Freq: Two times a day (BID) | ORAL | Status: DC

## 2016-10-20 MED ORDER — CLONAZEPAM 1 MG PO TABS
1.0000 mg | ORAL_TABLET | Freq: Two times a day (BID) | ORAL | Status: DC
  Administered 2016-10-20 – 2016-10-21 (×2): 1 mg via ORAL
  Filled 2016-10-20 (×2): qty 1

## 2016-10-20 MED ORDER — CLONAZEPAM 0.5 MG PO TABS: 0.5000 mg | ORAL_TABLET | Freq: Every day | ORAL | Status: DC

## 2016-10-20 NOTE — Plan of Care (Signed)
Problem: Activity: Goal: Interest or engagement in activities will improve Outcome: Progressing Pt attended NA this evening.    

## 2016-10-20 NOTE — BHH Group Notes (Addendum)
Adult Psychoeducational Group Note  Date:  10/20/2016 Time:  1:30 - 2:15 PM  Group Topic/Focus:   Emotional Regulation:   The focus of this group is to discuss what feelings/emotions are, and how they are experienced.  Participation Level:  {Invited, did not attend  Additional Comments:  Emotion Regulation: This group focused on both positive and negative emotion identification and allowed group members to process ways to identify feelings, regulate negative emotions, and find healthy ways to manage internal/external emotions. Group members were asked to reflect on a time when their reaction to an emotion led to a negative outcome and explored how alternative responses using emotion regulation would have benefited them. Group members were also asked to discuss a time when emotion regulation was utilized when a negative emotion was experienced.    Cereniti Curb C Yannet Rincon LCSW 10/20/2016, 2:45 PM  

## 2016-10-20 NOTE — Progress Notes (Signed)
Recreation Therapy Notes  Date:10/20/16 Time:0930 Location: 300 Hall Dayroom  Group Topic: Stress Management  Goal Area(s) Addresses:  Patient will verbalize importance of using healthy stress management.  Patient will identify positive emotions associated with healthy stress management.   Intervention: Stress Management  Activity: Meditation. Recreation Therapy Intern introduced the stress management technique of meditation. Recreation Therapy Intern read a script that allowed patients to take mental trip to the mountains. Recreation Therapy Intern played mountain meditation music. Patients were to follow along as script was read to engage in the activity.  Education:Stress Management, Discharge Planning.   Education Outcome:Needs additional education  Clinical Observations/Feedback:Pt did not attend group.  Rachel Meyer, Recreation Therapy Intern  Alick Lecomte, LRT/CTRS 

## 2016-10-20 NOTE — Progress Notes (Signed)
Patient attended NA group meeting. 
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
## 2016-10-20 NOTE — Progress Notes (Signed)
  DATA ACTION RESPONSE  Objective- Pt. is visible in the dayroom, seen interacting with peers. Presents with an animated    affect and mood. Pt. appears intrusive at times. C/O of insomnia this evening. Provider notified. See MAR.  Subjective- Denies having any SI/HI/AVH/Pain at this time. Pt. states "Can I have my klonopin now?". Is cooperative and remain safe on the unit.  1:1 interaction in private to establish rapport. Encouragement, education, & support given from staff.   Safety maintained with Q 15 checks. Continue with POC.

## 2016-10-20 NOTE — Progress Notes (Signed)
DAR NOTE Pt present with anxious mood, pt has been irritable over his medications, stating he is having withdrawal from note taking his klonopin as it suppose to be. Pt spent most of his am in bed, came ou for medications and meals. Pt stated he had a fair sleep last night, poor appetite, low energy, and poor concentration. Rates depression at 7, hopelessness at 7, and anxiety at 10. Denies SI/HI AVH and verbally contracted for safety. Medication given as prescribed. Support and encouragement offered as scheduled. No complain at this time, will continue to monitor.
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
## 2016-10-20 NOTE — Tx Team (Signed)
Interdisciplinary Treatment and Diagnostic Plan Update  10/20/2016 Time of Session: 0930AM Ronnie Moran MRN: [id number]  Principal Diagnosis: MDD, recurrent, severe  Secondary Diagnoses: Active Problems:   MDD (major depressive disorder), recurrent severe, without psychosis (HCC)   Current Medications:  Current Facility-Administered Medications  Medication Dose Route Frequency Provider Last Rate Last Dose  . acetaminophen (TYLENOL) tablet 650 mg  650 mg Oral Q6H PRN Withrow, John C, FNP      . buPROPion (WELLBUTRIN XL) 24 hr tablet 450 mg  450 mg Oral Daily Beau FannyWithrow, John C, FNP   450 mg at 10/20/16 0820  . clindamycin (CLEOCIN) capsule 300 mg  300 mg Oral TID Beau FannyWithrow, John C, FNP   300 mg at 10/20/16 0820  . clonazePAM (KLONOPIN) tablet 0.5 mg  0.5 mg Oral BID Beau FannyWithrow, John C, FNP   0.5 mg at 10/20/16 0819  . gabapentin (NEURONTIN) capsule 600 mg  600 mg Oral BID Beau FannyWithrow, John C, FNP   600 mg at 10/20/16 0820  . magnesium hydroxide (MILK OF MAGNESIA) suspension 30 mL  30 mL Oral Daily PRN Withrow, John C, FNP      . nicotine (NICODERM CQ - dosed in mg/24 hours) patch 21 mg  21 mg Transdermal Daily Cobos, Rockey SituFernando A, MD   21 mg at 10/20/16 [id number]  . traZODone (DESYREL) tablet 50 mg  50 mg Oral QHS PRN Beau FannyWithrow, John C, FNP   50 mg at 10/20/16 0046   PTA Medications: Prescriptions Prior to Admission  Medication Sig Dispense Refill Last Dose  . aspirin-acetaminophen-caffeine (EXCEDRIN MIGRAINE) 250-250-65 MG per tablet Take 2 tablets by mouth every 6 (six) hours as needed. pain    unknown  . buPROPion (WELLBUTRIN) 100 MG tablet Take 450 mg by mouth every morning. [facility] [phone]     . clindamycin (CLEOCIN) 300 MG capsule Take 300 mg by mouth 3 (three) times daily.   10/18/2016 at Unknown time  . clonazePAM (KLONOPIN) 1 MG tablet Take 1 mg by mouth 2 (two) times daily.   10/18/2016 at Unknown time  . gabapentin (NEURONTIN) 300 MG capsule Take 600 mg by mouth  2 (two) times daily.   10/18/2016 at Unknown time  . Sofosbuvir-Velpatasvir (EPCLUSA) 400-100 MG TABS Take 1 tablet by mouth daily. (Patient not taking: Reported on 10/18/2016) 28 tablet 2 Completed Course at Unknown time    Patient Stressors: Financial difficulties Substance abuse  Patient Strengths: Wellsite geologistCommunication skills General fund of knowledge Physical Health  Treatment Modalities: Medication Management, Group therapy, Case management,  1 to 1 session with clinician, Psychoeducation, Recreational therapy.   Physician Treatment Plan for Primary Diagnosis: MDD, recurrent, severe Long Term Goal(s): Improvement in symptoms so as ready for discharge Improvement in symptoms so as ready for discharge   Short Term Goals: Ability to identify changes in lifestyle to reduce recurrence of condition will improve Ability to verbalize feelings will improve Compliance with prescribed medications will improve Ability to identify triggers associated with substance abuse/mental health issues will improve Ability to disclose and discuss suicidal ideas Ability to identify and develop effective coping behaviors will improve  Medication Management: Evaluate patient's response, side effects, and tolerance of medication regimen.  Therapeutic Interventions: 1 to 1 sessions, Unit Group sessions and Medication administration.  Evaluation of Outcomes: Adequate for discharge   Physician Treatment Plan for Secondary Diagnosis: Active Problems:   MDD (major depressive disorder), recurrent severe, without psychosis (HCC)  Long Term Goal(s): Improvement in symptoms so as ready for discharge Improvement  in symptoms so as ready for discharge   Short Term Goals: Ability to identify changes in lifestyle to reduce recurrence of condition will improve Ability to verbalize feelings will improve Compliance with prescribed medications will improve Ability to identify triggers associated with substance abuse/mental  health issues will improve Ability to disclose and discuss suicidal ideas Ability to identify and develop effective coping behaviors will improve     Medication Management: Evaluate patient's response, side effects, and tolerance of medication regimen.  Therapeutic Interventions: 1 to 1 sessions, Unit Group sessions and Medication administration.  Evaluation of Outcomes: Adequate for discharge   RN Treatment Plan for Primary Diagnosis: MDD, recurrent, severe Long Term Goal(s): Knowledge of disease and therapeutic regimen to maintain health will improve  Short Term Goals: Ability to remain free from injury will improve, Ability to verbalize feelings will improve and Ability to disclose and discuss suicidal ideas  Medication Management: RN will administer medications as ordered by provider, will assess and evaluate patient's response and provide education to patient for prescribed medication. RN will report any adverse and/or side effects to prescribing provider.  Therapeutic Interventions: 1 on 1 counseling sessions, Psychoeducation, Medication administration, Evaluate responses to treatment, Monitor vital signs and CBGs as ordered, Perform/monitor CIWA, COWS, AIMS and Fall Risk screenings as ordered, Perform wound care treatments as ordered.  Evaluation of Outcomes: Adequate for discharge   LCSW Treatment Plan for Primary Diagnosis: MDD, recurrent, severe Long Term Goal(s): Safe transition to appropriate next level of care at discharge, Engage patient in therapeutic group addressing interpersonal concerns.  Short Term Goals: Engage patient in aftercare planning with referrals and resources, Facilitate patient progression through stages of change regarding substance use diagnoses and concerns and Identify triggers associated with mental health/substance abuse issues  Therapeutic Interventions: Assess for all discharge needs, 1 to 1 time with Social worker, Explore available resources and  support systems, Assess for adequacy in community support network, Educate family and significant other(s) on suicide prevention, Complete Psychosocial Assessment, Interpersonal group therapy.  Evaluation of Outcomes: Adequate for discharge    Progress in Treatment: Attending groups: Sometimes  Participating in groups: yes, when he attends; monopolizing and intrusive at times.  Taking medication as prescribed: Yes. Toleration medication: Yes. Family/Significant other contact made: No, will contact:  pt's grandmother Patient understands diagnosis: Yes. Discussing patient identified problems/goals with staff: No. Medical problems stabilized or resolved: No. Denies suicidal/homicidal ideation: Yes, per self report.  Issues/concerns per patient self-inventory: No. Other: n/a  New problem(s) identified: No, Describe:  n/a  New Short Term/Long Term Goal(s): medication management; detox; development of comprehensive mental wellness/sobriety plan.   Discharge Plan or Barriers: Pt plans to return home; follow-up with [facility] Pt does not want records sent to his PCP.   Reason for Continuation of Hospitalization: none  Estimated Length of Stay: discharge Friday 10/22/16  Attendees: Patient: 10/20/2016 10:32 AM  Physician: Dr. Lucianne Muss MD 10/20/2016 10:32 AM  Nursing: x 10/20/2016 10:32 AM  RN Care Manager: Nicolasa Ducking CM 10/20/2016 10:32 AM  Social Worker: Herbert Seta smart, LCSW 10/20/2016 10:32 AM  Recreational Therapist: x 10/20/2016 10:32 AM  Other: Armandina Stammer NP; Denzil Magnuson NP 10/20/2016 10:32 AM  Other:  10/20/2016 10:32 AM  Other: 10/20/2016 10:32 AM    Scribe for Treatment Team: Ledell Peoples Smart, LCSW 10/20/2016 10:32 AM

## 2016-10-21 ENCOUNTER — Encounter (HOSPITAL_COMMUNITY): Payer: Self-pay | Admitting: Behavioral Health

## 2016-10-21 DIAGNOSIS — F332 Major depressive disorder, recurrent severe without psychotic features: Secondary | ICD-10-CM

## 2016-10-21 DIAGNOSIS — F1520 Other stimulant dependence, uncomplicated: Secondary | ICD-10-CM

## 2016-10-21 DIAGNOSIS — F1490 Cocaine use, unspecified, uncomplicated: Secondary | ICD-10-CM

## 2016-10-21 DIAGNOSIS — F1290 Cannabis use, unspecified, uncomplicated: Secondary | ICD-10-CM

## 2016-10-21 DIAGNOSIS — F17210 Nicotine dependence, cigarettes, uncomplicated: Secondary | ICD-10-CM

## 2016-10-21 DIAGNOSIS — F1190 Opioid use, unspecified, uncomplicated: Secondary | ICD-10-CM

## 2016-10-21 DIAGNOSIS — F1514 Other stimulant abuse with stimulant-induced mood disorder: Secondary | ICD-10-CM

## 2016-10-21 DIAGNOSIS — F419 Anxiety disorder, unspecified: Secondary | ICD-10-CM

## 2016-10-21 MED ORDER — CLONAZEPAM 0.5 MG PO TABS
0.5000 mg | ORAL_TABLET | Freq: Three times a day (TID) | ORAL | Status: DC
  Administered 2016-10-21: 0.5 mg via ORAL
  Filled 2016-10-21: qty 1

## 2016-10-21 MED ORDER — CLONAZEPAM 1 MG PO TABS
1.0000 mg | ORAL_TABLET | Freq: Two times a day (BID) | ORAL | Status: DC
  Administered 2016-10-21 – 2016-10-25 (×8): 1 mg via ORAL
  Filled 2016-10-21 (×8): qty 1

## 2016-10-21 MED ORDER — CLONAZEPAM 1 MG PO TABS: 1.0000 mg | ORAL_TABLET | Freq: Two times a day (BID) | ORAL | Status: DC

## 2016-10-21 NOTE — Progress Notes (Signed)
DAR NOTE: Patient presents with anxious affect and depressed mood.  Pt has been in bed most of the am, had to be woken up for meds, pt states his goal is to stay in bed and has no interest in groups. Denies pain, auditory and visual hallucinations.  Rates depression at 5, hopelessness at 5, and anxiety at 5.  Maintained on routine safety checks.  Medications given as prescribed but was not happy with changes made to his medication.  Support and encouragement offered as needed. Will continue to monitor.
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
## 2016-10-21 NOTE — [facility] Group Notes (Signed)
[facility] LCSW Group Therapy  10/21/2016 12:06 PM  Type of Therapy:  Group Therapy  Participation Level:  Active  Participation Quality:  Intrusive and Monopolizing  Affect:  Labile  Cognitive:  Alert and Oriented  Insight:  Improving   Engagement in Therapy:  Improving  Modes of Intervention:  Discussion, Education, Exploration, Socialization and Support  Summary of Progress/Problems:  Finding Balance in Life. Today's group focused on defining balance in one's own words, identifying things that can knock one off balance, and exploring healthy ways to maintain balance in life. Group members were asked to provide an example of a time when they felt off balance, describe how they handled that situation,and process healthier ways to regain balance in the future. Group members were asked to share the most important tool for maintaining balance that [facility] and how they plan to apply this method after discharge.   Harlie Ragle N Smart LCSW 10/21/2016, 12:06 PM

## 2016-10-21 NOTE — Progress Notes (Signed)
[facility] MD Progress Note  10/21/2016 11:20 AM Ronnie Moran  MRN:  [id number]   Subjective:  " I am doing alright I guess. Cant say that I am doing to much better."  Evaluation on the unit: Face to face evaluation completed, case discussed during treatment team, chart reviewed. [name] is a 31 year old male admitted to [facility] for SI with a plan to jump into traffic.He has a history of heroin and methamphetamine abuse with the last use as reported 1 week ago prior to this admission. During this evaluation patient is alert and oriented x4, calm, and minimally engaged. He is noted laying in the bed and even when approached, refused to sit up or look at writer and irritable. He presents with a depressed mood and affect congruent. As per nursing; patient is intrusive at times. He continues to endorses depressed mood as well as anxiety. Reports both sleeping and eating pattern as fair. Denies withdrawal symptoms. Denies SI, HI, AVH and does not appear to be internally preoccupied. As per notes, patient has not been attending all groups. Support and encouragement provided. At this time, he is able to contract for safety on the unit.    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 MDD (major depressive disorder), recurrent severe, without psychosis (HCC) [F33.2] 10/19/2016  . Chronic hepatitis C without hepatic coma (HCC) [B18.2] 09/08/2016   Total Time spent with patient: 20 minutes  Past Psychiatric History: Bipolar, PTSD, OCDPatient reports receiving inpatient treatment, for suicidal ideations at a facility  [place], in 2017. He reports being attending a partial hospitalization program in [place] last year.Patient is currently not receiving services from any provider He reports current medications are  managed by his PCP. Reports current medications as Klonopin, Neurontin, and Wellbutrin. Reports wellbutrin was recently increased. Reports past medication trials as Buspar, Vistaril, Abilify, and Ativan.   Past Medical History:  Past Medical History:  Diagnosis Date  . Anxiety   . Hypotension   . Migraines     Past Surgical History:  Procedure Laterality Date  . ABDOMINAL SURGERY     Family History:  Family History  Problem Relation Age of Onset  . CAD Other    Family Psychiatric  History: maternal and paternal side-depression and anxiety. Father is a recovering alcoholic  Social History:  History  Alcohol Use No     History  Drug Use  . Types: Marijuana, Cocaine, Methamphetamines    Comment: socially    Social History   Social History  . Marital status: Single    Spouse name: N/A  . Number of children: N/A  . Years of education: N/A   Social History Main Topics  . Smoking status: Current Every Day Smoker    Packs/day: 0.50    Types: Cigarettes    Start date: 04/12/1997  . Smokeless tobacco: Never Used  . Alcohol use No  . Drug use: Yes    Types: Marijuana, Cocaine, Methamphetamines     Comment: socially  . Sexual activity: No   Other Topics Concern  . None   Social History Narrative  . None   Additional Social History:     Sleep: Fair  Appetite:  Fair  Current Medications: Current Facility-Administered Medications  Medication Dose Route Frequency Provider Last Rate Last Dose  . acetaminophen (TYLENOL) tablet 650 mg  650 mg Oral Q6H PRN Withrow, John C, FNP      . buPROPion (WELLBUTRIN XL) 24 hr tablet 450 mg  450 mg Oral Daily Beau Fanny, FNP   450 mg at 10/21/16 0818  . clindamycin (CLEOCIN) capsule 300 mg  300 mg Oral TID Beau Fanny, FNP   300 mg at 10/21/16 0818  . clonazePAM (KLONOPIN) tablet 1 mg  1 mg Oral BID Cobos, Rockey Situ, MD   1 mg at 10/21/16 0818  . gabapentin (NEURONTIN) capsule 600 mg  600 mg Oral BID Cobos, Rockey Situ, MD    600 mg at 10/21/16 0818  . magnesium hydroxide (MILK OF MAGNESIA) suspension 30 mL  30 mL Oral Daily PRN Withrow, John C, FNP      . nicotine (NICODERM CQ - dosed in mg/24 hours) patch 21 mg  21 mg Transdermal Daily Cobos, Rockey Situ, MD   21 mg at 10/21/16 0819  . traZODone (DESYREL) tablet 50 mg  50 mg Oral QHS,MR X 1 Kerry Hough, PA-C   50 mg at 10/20/16 2227    Lab Results: No results found for this or any previous visit (from the past 48 hour(s)).  Blood Alcohol level:  Lab Results  Component Value Date   ETH <5 10/18/2016    Metabolic Disorder Labs: No results found for: HGBA1C, MPG No results found for: PROLACTIN No results found for: CHOL, TRIG, HDL, CHOLHDL, VLDL, LDLCALC  Physical Findings: AIMS: Facial and Oral Movements Muscles of Facial Expression: None, normal Lips and Perioral Area: None, normal Jaw: None, normal Tongue: None, normal,Extremity Movements Upper (arms, wrists, hands, fingers): None, normal Lower (legs, knees, ankles, toes): None, normal, Trunk Movements Neck, shoulders, hips: None, normal, Overall Severity Severity of abnormal movements (highest score from questions above): None, normal Incapacitation due to abnormal movements: None, normal Patient's awareness of abnormal movements (rate only patient's report): No Awareness, Dental Status Current problems with teeth and/or dentures?: No Does patient usually wear dentures?: No  CIWA:    COWS:     Musculoskeletal: Strength & Muscle Tone: within normal limits Gait & Station: normal Patient leans: N/A  Psychiatric Specialty Exam: Physical Exam  Nursing note and vitals reviewed. Constitutional: He is oriented to person, place, and time.  Neurological: He is alert and oriented to person, place, and time.    Review of Systems  Psychiatric/Behavioral: Positive for depression. Negative for hallucinations, memory loss, substance abuse and suicidal ideas. The patient is nervous/anxious. The  patient does not have insomnia.   All other systems reviewed and are negative.   Blood pressure 122/76, pulse 83, temperature 98.1 F (36.7 C), temperature source Oral, resp. rate 18, height 5\' 8"  (1.727 m), weight 162 lb (73.5 kg).Body mass index is 24.63 kg/m.  General Appearance: Fairly Groomed  Eye Contact:  Poor  Speech:  Clear and Coherent and Normal Rate  Volume:  Normal  Mood:  Anxious, Depressed and Irritable  Affect:  Depressed and Restricted  Thought Process:  Coherent, Linear and Descriptions of Associations: Intact  Orientation:  Full (Time, Place, and Person)  Thought Content:  Logical denies AVH  Suicidal Thoughts:  No  Homicidal Thoughts:  No  Memory:  Immediate;   Fair Recent;   Fair  Judgement:  Impaired  Insight:  Shallow  Psychomotor Activity:  Normal  Concentration:  Concentration: Fair and Attention Span: Fair  Recall:  [employer] of Knowledge:  Fair  Language:  Good  Akathisia:  Negative  Handed:  Right  AIMS (if indicated):     Assets:  Desire for Improvement Social Support  ADL's:  Intact  Cognition:  WNL  Sleep:  Number of Hours: 6.75     Treatment Plan Summary: Daily contact with patient to assess and evaluate symptoms and progress in treatment   Medication management: Psychiatric conditions are unstable at this time. To reduce current symptoms to base line and improve the patient's overall level of functioning will continue the following plan with adjustments as noted;   Stimulant Dependence- Stimulant Induced Mood Disorder , Depressed-Continue on Wellbutrin 450 mgrs QDAY, Neurontin 600 mgrs BID  Anxiety- Spoke with MD about tapering Klonopin. As per MD, patient reported that once discharged, he would continue to take the Klonopin as previously prescribed. MD has discussed rationales for gradual BZD taper and avoidance of potentially addictive medications, but patient continues to insist on reaming on current dose. As per MD will continue  Klonopin to 1 mg po BID  per MD (Dr. Jama Flavorsobos) recommendations.   Other:  Safety: Will continue 15 minute observation for safety checks. Patient is able to contract for safety on the unit at this time  Labs: Will order TSH, HgbA1c, lipid panel   Continue to develop treatment plan to decrease risk of relapse upon discharge and to reduce the need for readmission.  Psycho-social education regarding relapse prevention and self care.  Health care follow up as needed for medical problems. Platelets 419  Continue to attend and participate in therapy.     Denzil MagnusonLaShunda Thomas, NP 10/21/2016, 11:20 AM  Agree with NP Progress Note

## 2016-10-21 NOTE — Progress Notes (Signed)
Patient attended karaoke group tonight.  

## 2016-10-22 ENCOUNTER — Telehealth: Payer: Self-pay | Admitting: *Deleted

## 2016-10-22 ENCOUNTER — Encounter (HOSPITAL_COMMUNITY): Payer: Self-pay | Admitting: Behavioral Health

## 2016-10-22 LAB — LIPID PANEL
Cholesterol: 187 mg/dL (ref 0–200)
HDL: 48 mg/dL (ref >40)
LDL Cholesterol: 118 mg/dL — ABNORMAL HIGH (ref 0–99)
Total CHOL/HDL Ratio: 3.9 RATIO
Triglycerides: 106 mg/dL (ref <150)
VLDL: 21 mg/dL (ref 0–40)

## 2016-10-22 LAB — TSH: TSH: 4.297 u[IU]/mL (ref 0.350–4.500)

## 2016-10-22 NOTE — Progress Notes (Signed)
Patient attended AA  Group meeting. 

## 2016-10-22 NOTE — Progress Notes (Signed)
Recreation Therapy Notes  Date: 10/22/2016 Time: 9:30am Location: [address] Dayroom  Group Topic: Stress Management  Goal Area(s) Addresses:  Patient will verbalize importance of using healthy stress management.  Patient will identify positive emotions associated with healthy stress management.   Intervention: Stress Management  Activity :  Meditation. Recreation Therapy Intern introduced the stress management technique of meditation. Recreation Therapy Intern read a script that allowed patients to pay attention to their breathing. Recreation Therapy Intern played zan meditation music. Patients were to follow along as script was read to engage in the activity.  Education: Stress Management, Discharge Planning.   Education Outcome: Needs additional edcuation  Clinical Observations/Feedback: Pt did not attended group.  Rachel Meyer, Recreation Therapy Intern  Sherene Plancarte, LRT/CTRS 

## 2016-10-22 NOTE — Progress Notes (Signed)
Patient ID: Ronnie Moran, male   DOB: 1986/02/14, 31 y.o.   MRN: [id number]   D: Patient pleasant on approach tonight. Reports ongoing anxiety but reports mood improved. Patient feels he needs to be here a little longer to prevent drug relapse. States that he will lose a lot if he doesn't stop using drugs. Denies abusing prescription klonopin and doesn't want that tapered while here. Patient is attention-seeking at times but pleasant. Per patient, he is excited about [facility] paying for the Hepatitis C cure for him and is going to try to call them tomorrow about when he needs to go there to start treatment.  A: Staff will monitor on q 15 minute checks, follow treatment plan, and give medications as ordered. R: Cooperative on the unit.

## 2016-10-22 NOTE — Telephone Encounter (Signed)
Patient called with questions about the Hep C Study Group and advised will have to have Priscille KluverKim E RN give him a call to answer his questions. He advised his phone is dead but that he can be reached at [phone] and his code is [phone]. Advised will give message but may be next week before she calls him back.

## 2016-10-22 NOTE — [facility] Suicide Risk Assessment (Signed)
[facility] INPATIENT:  Family/Significant Other Suicide Prevention Education  Suicide Prevention Education:  Contact Attempts: Ronnie Moran (pt's grandmother) [phone] has been identified by the patient as the family member/significant other with whom the patient will be residing, and identified as the person(s) who will aid the patient in the event of a mental health crisis.  With written consent from the patient, two attempts were made to provide suicide prevention education, prior to and/or following the patient's discharge.  We were unsuccessful in providing suicide prevention education.  A suicide education pamphlet was given to the patient to share with family/significant other.  Date and time of first attempt: 10/21/16 at 4:00PM (no answer/unable to leave voicemail)  Date and time of second attempt: 10/22/16 at 10:05AM (no answer/unable to leave voicemail)   Ledell PeoplesHeather N Smart LCSW 10/22/2016, 10:06 AM

## 2016-10-22 NOTE — Plan of Care (Signed)
Problem: Medication: Goal: Compliance with prescribed medication regimen will improve Outcome: Progressing Pt is taking meds as prescribed.    

## 2016-10-22 NOTE — [facility] Group Notes (Signed)
[facility] LCSW Group Therapy  10/22/2016 1:10 PM  Type of Therapy:  Group Therapy  Participation Level:  Did Not Attend-pt invited. In shower when group began. Did not attend.   Modes of Intervention:  Confrontation, Discussion, Education, Socialization and Support  Summary of Progress/Problems: Feelings around Relapse. Group members discussed the meaning of relapse and shared personal stories of relapse, how it affected them and others, and how they perceived themselves during this time. Group members were encouraged to identify triggers, warning signs and coping skills used when facing the possibility of relapse. Social supports were discussed and explored in detail. Post Acute Withdrawal Syndrome (handout provided) was introduced and examined. Pt's were encouraged to ask questions, talk about key points associated with PAWS, and process this information in terms of relapse prevention.   Ajee Heasley N Smart LCSW 10/22/2016, 1:10 PM
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
## 2016-10-22 NOTE — Progress Notes (Addendum)
[facility] MD Progress Note  10/22/2016 11:32 AM Ronnie Moran  MRN:  [id number]   Subjective:  "Why is it that you'll have changed my Klonopin 3 times since I have been here. What do you want me to do kill myself once I leave here. You don't want a law suit ."  Evaluation on the unit: Face to face evaluation completed, case discussed during treatment team, chart reviewed. 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  During this evaluation patient is very irritable and agitated after MD discussed protocol to taper his Klonopin. He threatened to sue the hospital if  klonopin did not remain at current dose. MD in detailed attempted to explain hospital protocol and even suggested increasing Neurontin during the tapering process and patient remained irritated. Patient has been focused in his klonopin since his admission. Patient voiced passive suicidal ideation during this evaluation. He made a suicidal gesture as if he would slice his throat if he was not discharged on his prior dose of Klonopin. Despite several discussions with MD's on rationales for gradual BZD taper and avoidance of potentially addictive medications, he continues to present as very anxious when speaking of his klonopin. He denies HI, AVH and does not appear to be preoccupied with internal stimuli. Denies withdrawal symptoms. At this time, he does contract for safety yet on the unit only.    As per nursing; Pt came to medication window at 1700 demanding to know why his klonopin was changed, pt stated he was going go into a seizure if the doctor was not called, pt also stated that we better get ready to tie him down and get haldol IM ready because " I am to going hurt somebody.'    Principal Problem: Other stimulant abuse with stimulant-induced mood disorder ([place]) Diagnosis:   Patient Active Problem List   Diagnosis Date  Noted  . Other stimulant abuse with stimulant-induced mood disorder ([place]) [F15.14] 10/21/2016  . MDD (major depressive disorder), recurrent severe, without psychosis ([place]) [F33.2] 10/19/2016  . Chronic hepatitis C without hepatic coma ([place]) [B18.2] 09/08/2016   Total Time spent with patient: 20 minutes  Past Psychiatric History: Bipolar, PTSD, OCDPatient reports receiving inpatient treatment, for suicidal ideations at a facility  [place], in 2017. He reports being attending a partial hospitalization program in [place] last year.Patient is currently not receiving services from any provider He reports current medications are managed by his PCP. Reports current medications as Klonopin, Neurontin, and Wellbutrin. Reports wellbutrin was recently increased. Reports past medication trials as Buspar, Vistaril, Abilify, and Ativan.   Past Medical History:  Past Medical History:  Diagnosis Date  . Anxiety   . Hypotension   . Migraines     Past Surgical History:  Procedure Laterality Date  . ABDOMINAL SURGERY     Family History:  Family History  Problem Relation Age of Onset  . CAD Other    Family Psychiatric  History: maternal and paternal side-depression and anxiety. Father is a recovering alcoholic  Social History:  History  Alcohol Use No     History  Drug Use  . Types: Marijuana, Cocaine, Methamphetamines    Comment: socially    Social History   Social History  . Marital status: Single    Spouse name: N/A  . Number of children: N/A  . Years of education: N/A   Social History Main  Topics  . Smoking status: Current Every Day Smoker    Packs/day: 0.50    Types: Cigarettes    Start date: 04/12/1997  . Smokeless tobacco: Never Used  . Alcohol use No  . Drug use: Yes    Types: Marijuana, Cocaine, Methamphetamines     Comment: socially  . Sexual activity: No   Other Topics Concern  . None   Social History Narrative  . None   Additional Social History:     Sleep:  Fair  Appetite:  Fair  Current Medications: Current Facility-Administered Medications  Medication Dose Route Frequency Provider Last Rate Last Dose  . acetaminophen (TYLENOL) tablet 650 mg  650 mg Oral Q6H PRN Withrow, John C, FNP      . buPROPion (WELLBUTRIN XL) 24 hr tablet 450 mg  450 mg Oral Daily Benjamine Mola, FNP   450 mg at 10/22/16 0857  . clindamycin (CLEOCIN) capsule 300 mg  300 mg Oral TID Benjamine Mola, FNP   300 mg at 10/22/16 0858  . clonazePAM (KLONOPIN) tablet 1 mg  1 mg Oral BID Lindon Romp A, NP   1 mg at 10/22/16 0858  . gabapentin (NEURONTIN) capsule 600 mg  600 mg Oral BID Isamar Wellbrock, Myer Peer, MD   600 mg at 10/22/16 0858  . magnesium hydroxide (MILK OF MAGNESIA) suspension 30 mL  30 mL Oral Daily PRN Withrow, John C, FNP      . nicotine (NICODERM CQ - dosed in mg/24 hours) patch 21 mg  21 mg Transdermal Daily Arlisha Patalano, Myer Peer, MD   21 mg at 10/22/16 0858  . traZODone (DESYREL) tablet 50 mg  50 mg Oral QHS,MR X 1 Laverle Hobby, PA-C   50 mg at 10/21/16 2258    Lab Results:  Results for orders placed or performed during the hospital encounter of 10/19/16 (from the past 48 hour(s))  TSH     Status: None   Collection Time: 10/22/16  6:38 AM  Result Value Ref Range   TSH 4.297 0.350 - 4.500 uIU/mL    Comment: Performed by a 3rd Generation assay with a functional sensitivity of <=0.01 uIU/mL. Performed at [facility], [place] [address], [place], [place] [address]   Lipid panel     Status: Abnormal   Collection Time: 10/22/16  6:38 AM  Result Value Ref Range   Cholesterol 187 0 - 200 mg/dL   Triglycerides 106 <150 mg/dL   HDL 48 >40 mg/dL   Total CHOL/HDL Ratio 3.9 RATIO   VLDL 21 0 - 40 mg/dL   LDL Cholesterol 118 (H) 0 - 99 mg/dL    Comment:        Total Cholesterol/HDL:CHD Risk Coronary Heart Disease Risk Table                     Men   Women  1/2 Average Risk   3.4   3.3  Average Risk       5.0   4.4  2 X Average Risk   9.6   7.1   3 X Average Risk  23.4   11.0        Use the calculated Patient Ratio above and the CHD Risk Table to determine the patient's CHD Risk.        ATP III CLASSIFICATION (LDL):  <100     mg/dL   Optimal  100-129  mg/dL   Near or Above  Optimal  130-159  mg/dL   Borderline  160-189  mg/dL   High  >190     mg/dL   Very High Performed at [place] [address] [place], [place] [address]     Blood Alcohol level:  Lab Results  Component Value Date   ETH <5 38/01/1750    Metabolic Disorder Labs: No results found for: HGBA1C, MPG No results found for: PROLACTIN Lab Results  Component Value Date   CHOL 187 10/22/2016   TRIG 106 10/22/2016   HDL 48 10/22/2016   CHOLHDL 3.9 10/22/2016   VLDL 21 10/22/2016   LDLCALC 118 (H) 10/22/2016    Physical Findings: AIMS: Facial and Oral Movements Muscles of Facial Expression: None, normal Lips and Perioral Area: None, normal Jaw: None, normal Tongue: None, normal,Extremity Movements Upper (arms, wrists, hands, fingers): None, normal Lower (legs, knees, ankles, toes): None, normal, Trunk Movements Neck, shoulders, hips: None, normal, Overall Severity Severity of abnormal movements (highest score from questions above): None, normal Incapacitation due to abnormal movements: None, normal Patient's awareness of abnormal movements (rate only patient's report): No Awareness, Dental Status Current problems with teeth and/or dentures?: No Does patient usually wear dentures?: No  CIWA:    COWS:     Musculoskeletal: Strength & Muscle Tone: within normal limits Gait & Station: normal Patient leans: N/A  Psychiatric Specialty Exam: Physical Exam  Nursing note and vitals reviewed. Constitutional: He is oriented to person, place, and time.  Neurological: He is alert and oriented to person, place, and time.    Review of Systems  Psychiatric/Behavioral: Positive for depression, substance abuse and suicidal  ideas. Negative for hallucinations and memory loss. The patient is nervous/anxious. The patient does not have insomnia.   All other systems reviewed and are negative.   Blood pressure 109/87, pulse 84, temperature 98.5 F (36.9 C), resp. rate 18, height 5' 8"  (1.727 m), weight 162 lb (73.5 kg).Body mass index is 24.63 kg/m.  General Appearance: Fairly Groomed and Guarded  Eye Contact:  Poor  Speech:  Clear and Coherent and Normal Rate  Volume:  Normal  Mood:  Anxious and Irritable  Affect:  Restricted and irritable  Thought Process:  Coherent, Linear and Descriptions of Associations: Intact  Orientation:  Full (Time, Place, and Person)  Thought Content:  Logical denies AVH  Suicidal Thoughts:  Yes.  without intent/plan; is able to contract for safety on the unit only   Homicidal Thoughts:  No  Memory:  Immediate;   Fair Recent;   Fair  Judgement:  Impaired  Insight:  Shallow  Psychomotor Activity:  Normal  Concentration:  Concentration: Fair and Attention Span: Fair  Recall:  [employer] of Knowledge:  Fair  Language:  Good  Akathisia:  Negative  Handed:  Right  AIMS (if indicated):     Assets:  Desire for Improvement Social Support  ADL's:  Intact  Cognition:  WNL  Sleep:  Number of Hours: 6.25     Treatment Plan Summary: Daily contact with patient to assess and evaluate symptoms and progress in treatment   Medication management: Psychiatric conditions are unstable at this time. Patient continues to present with a significant level of anxiety and irritability secondary to discussion about tapering Klonopin. To reduce current symptoms to base line and improve the patient's overall level of functioning will continue the following plan without adjustments per MD;   Stimulant Dependence- Stimulant Induced Mood Disorder , Depressed-Continue on Wellbutrin 450 mgrs QDAY, Neurontin 600  mgrs BID  Anxiety- Spoke with MD about tapering Klonopin. As per MD (Dr. Parke Poisson), patient will  continue current Klonopin dose without taper. Will continue Klonopin to 1 mg po BID.   Other:  Safety: Will continue 15 minute observation for safety checks. Patient is able to contract for safety on the unit at this time  Labs: Will order TSH normal, HgbA1c in process, lipid panel  LDL 118.  Continue to develop treatment plan to decrease risk of relapse upon discharge and to reduce the need for readmission.  Psycho-social education regarding relapse prevention and self care.  Health care follow up as needed for medical problems. Platelets 419, LDL 118.  Continue to attend and participate in therapy.     Mordecai Maes, NP 10/22/2016, 11:31 AM   Patient ID: Janace Litten, male   DOB: 11/30/1985, 31 y.o.   MRN: [id number]   I have discussed case with NP, staff , and have met with patient. Patient reports he feels partially better, but still anxious, irritable at times .  There are no psychotic symptoms and thought process is linear. He reports some ongoing depression, anxiety. At present denies any suicidal plan or intention and contracts for safety on the unit. He is focused on issues regarding Klonopin management , and has become agitated and irritable when discussing possible taper. Mood and affect did improve gradually  with empathy and review of concerns. States he has been on Klonopin for years and states " it helps me stay calm, it helps my mood from going all over the place, it actually helps me stay sober". States " off it, I am a mess and it makes me need to get high to feel better".  Denies abusing the prescribed Klonopin, and denies side effects from it .  Reports it is prescribed via his PCP. I reviewed Clonazepam addictive properties and rationale for BZD taper. Patient , as above, reluctant and very anxious about above, stating that off this medication he has historically done worse and has led to increased drug use. He is not currently motivated in BZD taper. He  states he intends to continue taking it as prescribed by his outpatient provider at discharge. He is aware of possible side effects, which he denies having. He focuses on methamphetamine as his major drug of choice and states " I need to stay away from the people who use it, and start going to NA again".  Based on above , will continue Klonopin  1 mgr BID at this time. Have encouraged him to consider a gradual outpatient taper of this medication on an outpatient basis, which he states he will discuss with prescribing physician.  Gabriel Earing MD

## 2016-10-23 DIAGNOSIS — Z79899 Other long term (current) drug therapy: Secondary | ICD-10-CM

## 2016-10-23 LAB — HEMOGLOBIN A1C
Hgb A1c MFr Bld: 5.4 % (ref 4.8–5.6)
Mean Plasma Glucose: 108 mg/dL

## 2016-10-23 MED ORDER — TRAZODONE HCL 50 MG PO TABS
50.0000 mg | ORAL_TABLET | Freq: Every evening | ORAL | Status: DC | PRN
  Administered 2016-10-23 – 2016-10-24 (×2): 50 mg via ORAL
  Filled 2016-10-23: qty 1

## 2016-10-23 NOTE — Plan of Care (Signed)
Problem: Activity: Goal: Sleeping patterns will improve Outcome: Progressing Pt slept 6.25 hrs last night.    

## 2016-10-23 NOTE — [facility] Group Notes (Signed)
[facility] LCSW Group Therapy Note  10/23/2016  and  1:15 to 2 PM  Type of Therapy and Topic:  Group Therapy: Avoiding Self-Sabotaging and Enabling Behaviors  Participation Level:  Active  Participation Quality:  Attentive and Sharing  Affect:  Appropriate  Cognitive:  Alert, Appropriate and Oriented  Insight:  Developing/Improving  Engagement in Therapy:  Developing/Improving   Therapeutic models used: Cognitive Behavioral Therapy,  Person-Centered Therapy and Motivational Interviewing  Modes of Intervention:  Discussion, Exploration, Orientation, Rapport Building, Socialization and Support  Summary of Progress/Problems:  The main focus of today's process group was for the patient to identify ways in which they have in the past sabotaged their own recovery. Motivational Interviewing was utilized to identify motivation they may have for wanting to change. Patient engaged easily and related to many of the self sabotaging patterns we develop and shared how many played a roll in his recovery relapse. Patient processed how routines are a good way for him to have some accountability.    Carney Bernatherine C Harrill, LCSW

## 2016-10-23 NOTE — Progress Notes (Signed)
Data. Patient denies SI/HI/AVH. Patient interacting well with staff and other patients. Affect flat,  but does brighten with interaction. Patient reports decrease in detox symptoms this shift. Action. Emotional support and encouragement offered. Education provided on medication, indications and side effect. Q 15 minute checks done for safety. Response. Safety on the unit maintained through 15 minute checks.  Medications taken as prescribed. Attended groups. Remained calm and appropriate through out shift.

## 2016-10-23 NOTE — Progress Notes (Signed)
[facility] MD Progress Note  10/23/2016 2:46 PM Ronnie Moran  MRN:  [id number]   Subjective:  Patient states " I am starting to feel better now that my medication is not being messed with". States his mood is improving . He is more future oriented today, and spoke about plans after discharge, mainly to pay some bills and return to work. States " I am a workaholic, it keeps me focused and from getting into trouble". States he plans to avoid certain people he had been socializing with prior to admission in order to minimize triggers for relapse/cravings, and also reports motivation to go to NA meetings after discharge. Denies medication side effects.   Objective : I have reviewed notes and have met with patient. Patient is presenting with partially improved mood and range of affect, although does appear vaguely irritable. Denies suicidal or self injurious ideations , states he remains anxious. As noted above, currently he is future oriented . As discussed in prior notes, patient has reported being on Klonopin for years, and has expressed significant reluctance to taper down or off, stating that he functions much better on this medication than off it. Denies abusing it. States " I know it is addictive, but it keeps me calm and  when I don't take it, that's when I want to relapse on drugs" We have reviewed medication side effects . No grossly disruptive behaviors on unit, less irritable today. More visible in day room.   Principal Problem: Other stimulant abuse with stimulant-induced mood disorder ([place]) Diagnosis:   Patient Active Problem List   Diagnosis Date Noted  . Other stimulant abuse with stimulant-induced mood disorder ([place]) [F15.14] 10/21/2016  . MDD (major depressive disorder), recurrent severe, without psychosis ([place]) [F33.2] 10/19/2016  . Chronic hepatitis C without hepatic coma ([place]) [B18.2] 09/08/2016   Total Time spent with patient: 20 minutes  Past Psychiatric History:  Bipolar, PTSD, OCDPatient reports receiving inpatient treatment, for suicidal ideations at a facility  [place], in 2017. He reports being attending a partial hospitalization program in [place] last year.Patient is currently not receiving services from any provider He reports current medications are managed by his PCP. Reports current medications as Klonopin, Neurontin, and Wellbutrin. Reports wellbutrin was recently increased. Reports past medication trials as Buspar, Vistaril, Abilify, and Ativan.   Past Medical History:  Past Medical History:  Diagnosis Date  . Anxiety   . Hypotension   . Migraines     Past Surgical History:  Procedure Laterality Date  . ABDOMINAL SURGERY     Family History:  Family History  Problem Relation Age of Onset  . CAD Other    Family Psychiatric  History: maternal and paternal side-depression and anxiety. Father is a recovering alcoholic  Social History:  History  Alcohol Use No     History  Drug Use  . Types: Marijuana, Cocaine, Methamphetamines    Comment: socially    Social History   Social History  . Marital status: Single    Spouse name: N/A  . Number of children: N/A  . Years of education: N/A   Social History Main Topics  . Smoking status: Current Every Day Smoker    Packs/day: 0.50    Types: Cigarettes    Start date: 04/12/1997  . Smokeless tobacco: Never Used  . Alcohol use No  . Drug use: Yes    Types: Marijuana, Cocaine, Methamphetamines     Comment: socially  . Sexual activity: No   Other Topics Concern  .  None   Social History Narrative  . None   Additional Social History:     Sleep: improved   Appetite:  improved   Current Medications: Current Facility-Administered Medications  Medication Dose Route Frequency Provider Last Rate Last Dose  . acetaminophen (TYLENOL) tablet 650 mg  650 mg Oral Q6H PRN Withrow, John C, FNP      . buPROPion (WELLBUTRIN XL) 24 hr tablet 450 mg  450 mg Oral Daily Benjamine Mola, FNP    450 mg at 10/23/16 0809  . clindamycin (CLEOCIN) capsule 300 mg  300 mg Oral TID Benjamine Mola, FNP   300 mg at 10/23/16 1259  . clonazePAM (KLONOPIN) tablet 1 mg  1 mg Oral BID Lindon Romp A, NP   1 mg at 10/23/16 0809  . gabapentin (NEURONTIN) capsule 600 mg  600 mg Oral BID Cobos, Myer Peer, MD   600 mg at 10/23/16 0809  . magnesium hydroxide (MILK OF MAGNESIA) suspension 30 mL  30 mL Oral Daily PRN Withrow, John C, FNP      . nicotine (NICODERM CQ - dosed in mg/24 hours) patch 21 mg  21 mg Transdermal Daily Cobos, Myer Peer, MD   21 mg at 10/23/16 0809  . traZODone (DESYREL) tablet 50 mg  50 mg Oral QHS,MR X 1 Laverle Hobby, PA-C   50 mg at 10/22/16 2224    Lab Results:  Results for orders placed or performed during the hospital encounter of 10/19/16 (from the past 48 hour(s))  TSH     Status: None   Collection Time: 10/22/16  6:38 AM  Result Value Ref Range   TSH 4.297 0.350 - 4.500 uIU/mL    Comment: Performed by a 3rd Generation assay with a functional sensitivity of <=0.01 uIU/mL. Performed at [facility], [place] [address], [place], [place] [address]   Hemoglobin A1c     Status: None   Collection Time: 10/22/16  6:38 AM  Result Value Ref Range   Hgb A1c MFr Bld 5.4 4.8 - 5.6 %    Comment: (NOTE)         Pre-diabetes: 5.7 - 6.4         Diabetes: >6.4         Glycemic control for adults with diabetes: <7.0    Mean Plasma Glucose 108 mg/dL    Comment: (NOTE) Performed At: [facility] [place], [place] [id number] Lindon Romp MD [id number] Performed at [facility], [place] [address], [place], [place] [address]   Lipid panel     Status: Abnormal   Collection Time: 10/22/16  6:38 AM  Result Value Ref Range   Cholesterol 187 0 - 200 mg/dL   Triglycerides 106 <150 mg/dL   HDL 48 >40 mg/dL   Total CHOL/HDL Ratio 3.9 RATIO   VLDL 21 0 - 40 mg/dL   LDL Cholesterol 118 (H) 0 - 99 mg/dL    Comment:         Total Cholesterol/HDL:CHD Risk Coronary Heart Disease Risk Table                     Men   Women  1/2 Average Risk   3.4   3.3  Average Risk       5.0   4.4  2 X Average Risk   9.6   7.1  3 X Average Risk  23.4   11.0  Use the calculated Patient Ratio above and the CHD Risk Table to determine the patient's CHD Risk.        ATP III CLASSIFICATION (LDL):  <100     mg/dL   Optimal  100-129  mg/dL   Near or Above                    Optimal  130-159  mg/dL   Borderline  160-189  mg/dL   High  >190     mg/dL   Very High Performed at [facility] [address], [place], [place] [address]     Blood Alcohol level:  Lab Results  Component Value Date   ETH <5 81/19/1478    Metabolic Disorder Labs: Lab Results  Component Value Date   HGBA1C 5.4 10/22/2016   MPG 108 10/22/2016   No results found for: PROLACTIN Lab Results  Component Value Date   CHOL 187 10/22/2016   TRIG 106 10/22/2016   HDL 48 10/22/2016   CHOLHDL 3.9 10/22/2016   VLDL 21 10/22/2016   LDLCALC 118 (H) 10/22/2016    Physical Findings: AIMS: Facial and Oral Movements Muscles of Facial Expression: None, normal Lips and Perioral Area: None, normal Jaw: None, normal Tongue: None, normal,Extremity Movements Upper (arms, wrists, hands, fingers): None, normal Lower (legs, knees, ankles, toes): None, normal, Trunk Movements Neck, shoulders, hips: None, normal, Overall Severity Severity of abnormal movements (highest score from questions above): None, normal Incapacitation due to abnormal movements: None, normal Patient's awareness of abnormal movements (rate only patient's report): No Awareness, Dental Status Current problems with teeth and/or dentures?: No Does patient usually wear dentures?: No  CIWA:    COWS:     Musculoskeletal: Strength & Muscle Tone: within normal limits Gait & Station: normal Patient leans: N/A  Psychiatric Specialty Exam: Physical Exam  Nursing note and vitals  reviewed. Constitutional: He is oriented to person, place, and time.  Neurological: He is alert and oriented to person, place, and time.    Review of Systems  Psychiatric/Behavioral: Positive for depression, substance abuse and suicidal ideas. Negative for hallucinations and memory loss. The patient is nervous/anxious. The patient does not have insomnia.   All other systems reviewed and are negative. denies chest pain, no dyspnea, no vomiting   Blood pressure 109/67, pulse 80, temperature 97.8 F (36.6 C), temperature source Oral, resp. rate 18, height 5' 8"  (1.727 m), weight 73.5 kg (162 lb).Body mass index is 24.63 kg/m.  General Appearance: Fairly Groomed  Eye Contact:  Fair- improving   Speech:  Normal Rate  Volume:  Normal  Mood:  reports he is feeling better, presents less dysphoric  Affect:  less irritable than yesterday  Thought Process:  Linear and Descriptions of Associations: Intact  Orientation:  Full (Time, Place, and Person)  Thought Content:  no hallucinations, no delusions, not internally preoccupied    Suicidal Thoughts:  has reported intermittent passive SI. Today denies suicidal plans or ideations, and denies suicidal ideations, also denies any homicidal ideations; is able to contract for safety on the unit only   Homicidal Thoughts:  No  Memory:  Recent and remote grossly intact   Judgement:  Fair- improving   Insight:  Fair  Psychomotor Activity:  Normal  Concentration:  Concentration: Good and Attention Span: Good  Recall:  Good  Fund of Knowledge:  Good  Language:  Good  Akathisia:  Negative  Handed:  Right  AIMS (if indicated):     Assets:  Communication Skills Desire for Improvement  ADL's:  Intact  Cognition:  WNL  Sleep:  Number of Hours: 6.25    Assessment - Patient presents with partial improvement - compared to yesterday is less irritable, less agitated, and at this time is endorsing partially improved mood and is becoming more future oriented ,  interested in treatment options after discharge. Improvement is partial, and does still present with some dysphoria and irritability. He has been focused on not tapering BZD down or off, and states that prior attempts to do so have resulted in worsening anxiety and increase in illicit drug use. Denies any side effects, and states it is prescribed and monitored by his outpatient MD/PCP.   Treatment Plan Summary: Daily contact with patient to assess and evaluate symptoms and progress in treatment  Encourage group participation, work on Radiographer, therapeutic  Encourage efforts to work on sobriety , relapse prevention Continue Wellbutrin XL 450 mgrs QDAY for depression Continue Klonopin 1 mgr BID for anxiety  Continue Neurontin 600 mgrs BID for anxiety, pain Treatment team working on disposition planning options     Jenne Campus, MD 10/23/2016, 2:46 PM   Patient ID: Ronnie Moran, male   DOB: [birth year], 31 y.o.   MRN: [id number]

## 2016-10-23 NOTE — Progress Notes (Signed)
  DATA ACTION RESPONSE  Objective- Pt. is visible in the dayroom, seen interacting with peers. Presents with a labile     affect and mood. Pt. appears easily irritable at  times. C/o of dry throat this evening.  Subjective- Denies having any SI/HI/AVH/Pain at this time. Is cooperative and remain safe on the unit.  1:1 interaction in private to establish rapport. Encouragement, education, & support given from staff. Medications ordered and given.   Safety maintained with Q 15 checks. Continue with POC.

## 2016-10-23 NOTE — Progress Notes (Signed)
Patient attended group and said his day was a 7.  Patient complained about not going outside. He also spoke about missing breakfast this morning, although he got a plate.

## 2016-10-24 MED ORDER — TRAZODONE HCL 50 MG PO TABS
50.0000 mg | ORAL_TABLET | Freq: Every evening | ORAL | Status: DC | PRN
  Administered 2016-10-24: 50 mg via ORAL
  Filled 2016-10-24: qty 1
  Filled 2016-10-24 (×3): qty 14
  Filled 2016-10-24: qty 1
  Filled 2016-10-24: qty 14

## 2016-10-24 MED ORDER — NICOTINE POLACRILEX 2 MG MT GUM: 2.0000 mg | CHEWING_GUM | OROMUCOSAL | Status: DC | PRN

## 2016-10-24 MED ORDER — NICOTINE POLACRILEX 2 MG MT GUM
CHEWING_GUM | OROMUCOSAL | Status: AC
  Filled 2016-10-24: qty 1

## 2016-10-24 NOTE — Progress Notes (Signed)
Patient ID: Ronnie ShearerBrandon Moran, male   DOB: 1985/10/28, 31 y.o.   MRN: [id number]  Pt currently presents with a flat affect and guarded behavior. Pt reports to Clinical research associatewriter that their goal is to "get a snack." Pt answers are short, minimal. Pt chooses not to attend group tonight, reports that he did not need to "go to that #$%$ing group." Pt reports intermittent sleep with current medication regimen. Pt interacts positively with peers on the unit.   Pt provided with medications per providers orders. Pt's labs and vitals were monitored throughout the night. Pt given a 1:1 about emotional and mental status. Pt supported and encouraged to express concerns and questions. Pt educated on medications.  Pt's safety ensured with 15 minute and environmental checks. Pt currently denies SI/HI and A/V hallucinations. Pt verbally agrees to seek staff if SI/HI or A/VH occurs and to consult with staff before acting on any harmful thoughts. Will continue POC.

## 2016-10-24 NOTE — [facility] Counselor (Signed)
    [facility] LCSW Group Therapy  10/24/2016 10:15 AM  Type of Therapy:  Group Therapy  Participation Level:  Did Not Attend; despite overhead announcement and encouragement from CSW patient choose to remain in bed verses attending group.  Summary of Progress/Problems: Topic for today was thoughts and feelings regarding discharge. We discussed fears of upcoming changes including judgements, expectations and stigma of mental health issues. We then discussed supports: what constitutes a supportive framework, identification of supports and what to do when others are not supportive. Patients then identified a specific coping tool to use when others are not available.    Carney Bernatherine C Amoy Steeves, LCSW

## 2016-10-24 NOTE — [facility] Group Notes (Signed)
[facility] Group Notes:  (Nursing/MHT/Case Management/Adjunct)  Date:  10/24/2016  Time:  3:20 PM  Type of Therapy:  Nurse Education  Participation Level:  Active  Participation Quality:  Appropriate  Affect:  Appropriate  Cognitive:  Appropriate  Insight:  Appropriate and Good  Engagement in Group:  Engaged  Modes of Intervention:  Activity  Summary of Progress/Problems:  Group activity whereby each person wrote positive attributes about all other members of the group. Patients then read the positive statements about themselves and described how reading them , changed, or didn't chang their view of them self.   Ronnie Moran [name] 10/24/2016, 3:20 PM

## 2016-10-24 NOTE — Progress Notes (Signed)
Pt did not attend AA meeting this evening.  

## 2016-10-24 NOTE — Progress Notes (Signed)
Data. Patient denies SI/HI/AVH. Patient interacting well with staff and other patients. Affect is bright. He has been expressing some concern about his discharge medications and how he will afford them. He was referred to SW and was able to have this concern addressed.  Action. Emotional support and encouragement offered. Education provided on medication, indications and side effect. Q 15 minute checks done for safety. Response. Safety on the unit maintained through 15 minute checks.  Medications taken as prescribed. Attended groups. Remained calm and appropriate through out shift.

## 2016-10-24 NOTE — Progress Notes (Signed)
  DATA ACTION RESPONSE  Objective- Pt. is visible in the dayroom, seen interacting with peers. Presents with a labile     affect and mood. Pt. appears to get easily irritated with staff. Pt. avoided eye contact with Clinical research associatewriter; elective mutism. C/o of dry throat this evening.   Subjective- Denies having any SI/HI/AVH/Pain at this time.Pt. states "I am filing a   Grievance". Remains safe on the unit.  1:1 interaction in private to establish rapport. Encouragement, education, & support given from staff.  PRN Trazodone requested and will re-eval accordingly.   Safety maintained with Q 15 checks. Continue with POC.

## 2016-10-24 NOTE — Plan of Care (Signed)
Problem: Activity: Goal: Interest or engagement in leisure activities will improve Outcome: Progressing Patient has been attending most unit groups.

## 2016-10-24 NOTE — Progress Notes (Signed)
[facility] MD Progress Note  10/24/2016 5:27 PM Ronnie Moran  MRN:  [id number]   Subjective:  Patient reports he is feeling better, " like back to normal", and at this time is future oriented, focused on being discharged soon . Reports " I am falling behind on paying bills and I need to get back to works, I have clients ready for Tuesday " ( he is a hair stylist ) . Denies cravings and reports a high level of motivation to remain sober from methamphetamine and other illicit drugs . He denies medication side effects. He denies any suicidal ideations.  Objective : I have reviewed notes and have met with patient. Patient presents improved , and states he is feeling much better. At this time is focused on being discharged soon, and is future oriented, stating he needs to return to work.  He presents pleasant, and today not irritable or angry. He denies feeling depressed at this time. He is visible in milieu, and has been going to some groups. Noted to interact appropriately with selected peers. Denies medication side effects. Denies suicidal ideations .   Principal Problem: Other stimulant abuse with stimulant-induced mood disorder ([place]) Diagnosis:   Patient Active Problem List   Diagnosis Date Noted  . Other stimulant abuse with stimulant-induced mood disorder ([place]) [F15.14] 10/21/2016  . MDD (major depressive disorder), recurrent severe, without psychosis ([place]) [F33.2] 10/19/2016  . Chronic hepatitis C without hepatic coma ([place]) [B18.2] 09/08/2016   Total Time spent with patient: 20 minutes  Past Psychiatric History: Bipolar, PTSD, OCDPatient reports receiving inpatient treatment, for suicidal ideations at a facility  [place], in 2017. He reports being attending a partial hospitalization program in [place] last year.Patient is currently not receiving services from any provider He reports current medications are managed by his PCP. Reports current medications as Klonopin, Neurontin,  and Wellbutrin. Reports wellbutrin was recently increased. Reports past medication trials as Buspar, Vistaril, Abilify, and Ativan.   Past Medical History:  Past Medical History:  Diagnosis Date  . Anxiety   . Hypotension   . Migraines     Past Surgical History:  Procedure Laterality Date  . ABDOMINAL SURGERY     Family History:  Family History  Problem Relation Age of Onset  . CAD Other    Family Psychiatric  History: maternal and paternal side-depression and anxiety. Father is a recovering alcoholic  Social History:  History  Alcohol Use No     History  Drug Use  . Types: Marijuana, Cocaine, Methamphetamines    Comment: socially    Social History   Social History  . Marital status: Single    Spouse name: N/A  . Number of children: N/A  . Years of education: N/A   Social History Main Topics  . Smoking status: Current Every Day Smoker    Packs/day: 0.50    Types: Cigarettes    Start date: 04/12/1997  . Smokeless tobacco: Never Used  . Alcohol use No  . Drug use: Yes    Types: Marijuana, Cocaine, Methamphetamines     Comment: socially  . Sexual activity: No   Other Topics Concern  . None   Social History Narrative  . None   Additional Social History:     Sleep: Good  Appetite:  Good  Current Medications: Current Facility-Administered Medications  Medication Dose Route Frequency Provider Last Rate Last Dose  . acetaminophen (TYLENOL) tablet 650 mg  650 mg Oral Q6H PRN Benjamine Mola, FNP   650  mg at 10/24/16 1214  . buPROPion (WELLBUTRIN XL) 24 hr tablet 450 mg  450 mg Oral Daily Benjamine Mola, FNP   450 mg at 10/24/16 0735  . clindamycin (CLEOCIN) capsule 300 mg  300 mg Oral TID Benjamine Mola, FNP   300 mg at 10/24/16 1629  . clonazePAM (KLONOPIN) tablet 1 mg  1 mg Oral BID Lindon Romp A, NP   1 mg at 10/24/16 1629  . gabapentin (NEURONTIN) capsule 600 mg  600 mg Oral BID Severn Goddard, Myer Peer, MD   600 mg at 10/24/16 1629  . magnesium hydroxide  (MILK OF MAGNESIA) suspension 30 mL  30 mL Oral Daily PRN Withrow, John C, FNP      . nicotine polacrilex (NICORETTE) gum 2 mg  2 mg Oral PRN Banks Chaikin, Myer Peer, MD      . traZODone (DESYREL) tablet 50 mg  50 mg Oral QHS PRN Tanaja Ganger, Myer Peer, MD   50 mg at 10/23/16 2154    Lab Results:  No results found for this or any previous visit (from the past 48 hour(s)).  Blood Alcohol level:  Lab Results  Component Value Date   ETH <5 76/16/0737    Metabolic Disorder Labs: Lab Results  Component Value Date   HGBA1C 5.4 10/22/2016   MPG 108 10/22/2016   No results found for: PROLACTIN Lab Results  Component Value Date   CHOL 187 10/22/2016   TRIG 106 10/22/2016   HDL 48 10/22/2016   CHOLHDL 3.9 10/22/2016   VLDL 21 10/22/2016   LDLCALC 118 (H) 10/22/2016    Physical Findings: AIMS: Facial and Oral Movements Muscles of Facial Expression: None, normal Lips and Perioral Area: None, normal Jaw: None, normal Tongue: None, normal,Extremity Movements Upper (arms, wrists, hands, fingers): None, normal Lower (legs, knees, ankles, toes): None, normal, Trunk Movements Neck, shoulders, hips: None, normal, Overall Severity Severity of abnormal movements (highest score from questions above): None, normal Incapacitation due to abnormal movements: None, normal Patient's awareness of abnormal movements (rate only patient's report): No Awareness, Dental Status Current problems with teeth and/or dentures?: No Does patient usually wear dentures?: No  CIWA:    COWS:     Musculoskeletal: Strength & Muscle Tone: within normal limits Gait & Station: normal Patient leans: N/A  Psychiatric Specialty Exam: Physical Exam  Nursing note and vitals reviewed. Constitutional: He is oriented to person, place, and time.  Neurological: He is alert and oriented to person, place, and time.    Review of Systems  Psychiatric/Behavioral: Positive for depression, substance abuse and suicidal ideas. Negative  for hallucinations and memory loss. The patient is nervous/anxious. The patient does not have insomnia.   All other systems reviewed and are negative. no headache, no chest pain , no shortness of breath, no vomiting , no fever, no chills   Blood pressure 131/78, pulse 68, temperature 97.7 F (36.5 C), temperature source Oral, resp. rate 20, height 5' 8"  (1.727 m), weight 73.5 kg (162 lb).Body mass index is 24.63 kg/m.  General Appearance: Well Groomed  Eye Contact:  Good-  Speech:  Normal Rate  Volume:  Normal  Mood:  improved mood, presents euthymic at this time  Affect:  more reactive, not irritable at this time  Thought Process:  Linear and Descriptions of Associations: Intact  Orientation:  Full (Time, Place, and Person)  Thought Content:  no hallucinations, no delusions, not internally preoccupied    Suicidal Thoughts:  No denies any suicidal or self injurious ideations, future  oriented, denies any homicidal or violent ideations   Homicidal Thoughts:  No  Memory:  Recent and remote grossly intact   Judgement:  Fair- improving   Insight:  Fair  Psychomotor Activity:  Normal  Concentration:  Concentration: Good and Attention Span: Good  Recall:  Good  Fund of Knowledge:  Good  Language:  Good  Akathisia:  Negative  Handed:  Right  AIMS (if indicated):     Assets:  Communication Skills Desire for Improvement  ADL's:  Intact  Cognition:  WNL  Sleep:  Number of Hours: 6.25    Assessment - patient presents with improved mood and range of affect. He denies depression at this time, denies any SI, and is future oriented , currently focused on returning to work soon. Denies medication side effects and states he feels current medications are helping and well tolerated. Reports decreased nicotine cravings on Wellbutrin, and states he intends to quit smoking .  We have again reviewed issues regarding Klonopin - patient significantly  less irritable about this issue today. States he has  been on Klonopin for many  years, and that he is aware it is addictive but that he has not abused it. States that off it, he tends to feel more anxious, much more labile and is more likely to relapse on drugs. He is aware of potential rationale for taper of BZD based on his history of substance abuse, but states" in my case, coming off Klonopin makes me more likely to go use drugs ".    Treatment Plan Summary: Daily contact with patient to assess and evaluate symptoms and progress in treatment  Treatment plan reviewed as below today 7/15.  Encourage group participation, work on Radiographer, therapeutic  Encourage efforts to work on sobriety , relapse prevention Continue Wellbutrin XL 450 mgrs QDAY for depression Continue Klonopin 1 mgr BID for anxiety  Continue Neurontin 600 mgrs BID for anxiety, pain Treatment team working on disposition planning options     Jenne Campus, MD 10/24/2016, 5:27 PM   Patient ID: Ronnie Moran, male   DOB: 1985-12-13, 31 y.o.   MRN: [id number]  Patient ID: Ronnie Moran, male   DOB: [birth year], 31 y.o.   MRN: [id number]

## 2016-10-25 MED ORDER — TRAZODONE HCL 50 MG PO TABS: ORAL_TABLET | ORAL | 0 refills | Status: AC

## 2016-10-25 MED ORDER — TRAZODONE HCL 50 MG PO TABS
50.0000 mg | ORAL_TABLET | Freq: Every day | ORAL | Status: DC
  Filled 2016-10-25: qty 7

## 2016-10-25 MED ORDER — BUPROPION HCL ER (XL) 150 MG PO TB24: 450.0000 mg | ORAL_TABLET | Freq: Every day | ORAL | Status: DC

## 2016-10-25 MED ORDER — BUPROPION HCL ER (XL) 450 MG PO TB24: 450.0000 mg | ORAL_TABLET | Freq: Every day | ORAL | 0 refills | Status: AC

## 2016-10-25 MED ORDER — CLONAZEPAM 1 MG PO TABS: 1.0000 mg | ORAL_TABLET | Freq: Two times a day (BID) | ORAL | 0 refills | Status: DC

## 2016-10-25 MED ORDER — NICOTINE 21 MG/24HR TD PT24
21.0000 mg | MEDICATED_PATCH | Freq: Every day | TRANSDERMAL | Status: DC
  Administered 2016-10-25: 21 mg via TRANSDERMAL
  Filled 2016-10-25 (×2): qty 1

## 2016-10-25 MED ORDER — NICOTINE 21 MG/24HR TD PT24: 21.0000 mg | MEDICATED_PATCH | Freq: Every day | TRANSDERMAL | 0 refills | Status: AC

## 2016-10-25 MED ORDER — BUPROPION HCL ER (SR) 150 MG PO TB12: 450.0000 mg | ORAL_TABLET | Freq: Every day | ORAL | Status: DC

## 2016-10-25 MED ORDER — CLONAZEPAM 0.5 MG PO TABS: ORAL_TABLET | ORAL | 0 refills | Status: AC

## 2016-10-25 MED ORDER — GABAPENTIN 300 MG PO CAPS: 600.0000 mg | ORAL_CAPSULE | Freq: Two times a day (BID) | ORAL | 0 refills | Status: AC
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
## 2016-10-25 NOTE — Tx Team (Signed)
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 acetaminophen (TYLENOL) tablet 650 mg  650 mg Oral Q6H PRN Beau Fanny, FNP   650 mg at 10/25/16 0759  . buPROPion (WELLBUTRIN XL) 24 hr tablet 450 mg  450 mg Oral Daily Beau Fanny, FNP   450 mg at 10/25/16 0758  . clindamycin (CLEOCIN) capsule 300 mg  300 mg Oral TID Beau Fanny, FNP   300 mg at 10/25/16 0757  . clonazePAM (KLONOPIN) tablet 1 mg  1 mg Oral BID Nira Conn A, NP   1 mg at 10/25/16 0758  . gabapentin (NEURONTIN) capsule 600 mg  600 mg Oral BID Cobos, Rockey Situ, MD   600 mg at 10/25/16 0758  . magnesium hydroxide (MILK OF MAGNESIA) suspension 30 mL  30 mL Oral Daily PRN Withrow, John C, FNP      . nicotine (NICODERM CQ - dosed in mg/24 hours) patch 21 mg  21 mg Transdermal Daily Cobos, Rockey Situ, MD   21 mg at 10/25/16 0800  . traZODone (DESYREL) tablet 50 mg  50 mg Oral QHS,MR X 1 Nira Conn A, NP   50 mg at 10/24/16 2213   PTA Medications: Prescriptions Prior to Admission  Medication Sig Dispense Refill Last Dose  . aspirin-acetaminophen-caffeine (EXCEDRIN MIGRAINE) 250-250-65 MG per tablet Take 2 tablets by mouth every 6 (six) hours as needed. pain    unknown  . buPROPion (WELLBUTRIN) 100 MG tablet Take 450 mg by mouth every morning. Verified at [facility] [phone]     . clindamycin (CLEOCIN) 300 MG capsule Take 300 mg by mouth 3 (three) times daily.   10/18/2016 at Unknown time  . clonazePAM (KLONOPIN) 1 MG tablet Take 1 mg by mouth 2 (two)  times daily.   10/18/2016 at Unknown time  . gabapentin (NEURONTIN) 300 MG capsule Take 600 mg by mouth 2 (two) times daily.   10/18/2016 at Unknown time  . Sofosbuvir-Velpatasvir (EPCLUSA) 400-100 MG TABS Take 1 tablet by mouth daily. (Patient not taking: Reported on 10/18/2016) 28 tablet 2 Completed Course at Unknown time    Patient Stressors: Financial difficulties Substance abuse  Patient Strengths: Wellsite geologist fund of knowledge Physical Health  Treatment Modalities: Medication Management, Group therapy, Case management,  1 to 1 session with clinician, Psychoeducation, Recreational therapy.   Physician Treatment Plan for Primary Diagnosis: MDD, recurrent, severe Long Term Goal(s): Improvement in symptoms so as ready for discharge Improvement in symptoms so as ready for discharge   Short Term Goals: Ability to identify changes in lifestyle to reduce recurrence of condition will improve Ability to verbalize feelings will improve Compliance with prescribed medications will improve Ability to identify triggers associated with substance abuse/mental health issues will improve Ability to disclose and discuss suicidal ideas Ability to identify and develop effective coping behaviors will improve  Medication Management: Evaluate patient's response, side effects, and tolerance of medication regimen.  Therapeutic Interventions: 1 to 1 sessions, Unit Group sessions and Medication administration.  Evaluation of Outcomes: Adequate for discharge   Physician Treatment Plan for Secondary Diagnosis: Principal Problem:   Other stimulant abuse with stimulant-induced mood  disorder (HCC) Active Problems:   MDD (major depressive disorder), recurrent severe, without psychosis (HCC)  Long Term Goal(s): Improvement in symptoms so as ready for discharge Improvement in symptoms so as ready for discharge   Short Term Goals: Ability to identify changes in lifestyle to reduce recurrence of  condition will improve Ability to verbalize feelings will improve Compliance with prescribed medications will improve Ability to identify triggers associated with substance abuse/mental health issues will improve Ability to disclose and discuss suicidal ideas Ability to identify and develop effective coping behaviors will improve     Medication Management: Evaluate patient's response, side effects, and tolerance of medication regimen.  Therapeutic Interventions: 1 to 1 sessions, Unit Group sessions and Medication administration.  Evaluation of Outcomes: Adequate for discharge   RN Treatment Plan for Primary Diagnosis: MDD, recurrent, severe Long Term Goal(s): Knowledge of disease and therapeutic regimen to maintain health will improve  Short Term Goals: Ability to remain free from injury will improve, Ability to verbalize feelings will improve and Ability to disclose and discuss suicidal ideas  Medication Management: RN will administer medications as ordered by provider, will assess and evaluate patient's response and provide education to patient for prescribed medication. RN will report any adverse and/or side effects to prescribing provider.  Therapeutic Interventions: 1 on 1 counseling sessions, Psychoeducation, Medication administration, Evaluate responses to treatment, Monitor vital signs and CBGs as ordered, Perform/monitor CIWA, COWS, AIMS and Fall Risk screenings as ordered, Perform wound care treatments as ordered.  Evaluation of Outcomes: Adequate for discharge   LCSW Treatment Plan for Primary Diagnosis: MDD, recurrent, severe Long Term Goal(s): Safe transition to appropriate next level of care at discharge, Engage patient in therapeutic group addressing interpersonal concerns.  Short Term Goals: Engage patient in aftercare planning with referrals and resources, Facilitate patient progression through stages of change regarding substance use diagnoses and concerns and Identify  triggers associated with mental health/substance abuse issues  Therapeutic Interventions: Assess for all discharge needs, 1 to 1 time with Social worker, Explore available resources and support systems, Assess for adequacy in community support network, Educate family and significant other(s) on suicide prevention, Complete Psychosocial Assessment, Interpersonal group therapy.  Evaluation of Outcomes: Adequate for discharge   Progress in Treatment: Attending groups: Sometimes  Participating in groups: yes, when he attends; monopolizing and intrusive at times.  Taking medication as prescribed: Yes. Toleration medication: Yes. Family/Significant other contact made: Contact attempts made with pt's grandmother. SPE completed with pt.  Patient understands diagnosis: Yes. Discussing patient identified problems/goals with staff: No. Medical problems stabilized or resolved: No. Denies suicidal/homicidal ideation: Yes, per self report.  Issues/concerns per patient self-inventory: No. Other: n/a  New problem(s) identified: Pt labile, irritable, minimally vested in attending groups or participating in treatment plan. This has been pt's behavior since admission. Small improvement--pt appears to be presenting at his baseline.   New Short Term/Long Term Goal(s): medication management; detox; development of comprehensive mental wellness/sobriety plan.   Discharge Plan or Barriers: Pt plans to return home; follow-up with [facility]. Pt does not want records sent to his PCP.   Reason for Continuation of Hospitalization: none  Estimated Length of Stay: Monday, 10/25/16  Attendees: Patient: 10/25/2016 8:34 AM  Physician: Dr. Lucianne Muss MD 10/25/2016 8:34 AM  Nursing: Foy Guadalajara RN 10/25/2016 8:34 AM  RN Care Manager: x 10/25/2016 8:34 AM  Social Worker: Herbert Seta smart, LCSW 10/25/2016 8:34 AM  Recreational Therapist: x 10/25/2016 8:34 AM  Other: Armandina Stammer NP; Hillery Jacks NP 10/25/2016 8:34  AM  Other:   10/25/2016 8:34 AM  Other: 10/25/2016 8:34 AM    Scribe for Treatment Team: Ledell PeoplesHeather N Smart, LCSW 10/25/2016 8:34 AM

## 2016-10-25 NOTE — Progress Notes (Signed)
Recreation Therapy Notes  Date: 10/25/2016 Time:9:30am Location: [address] Dayroom  Group Topic: Stress Management  Goal Area(s) Addresses:  Patient will verbalize importance of using healthy stress management.  Patient will identify positive emotions associated with healthy stress management.   Intervention: Stress Management  Activity: Mindful Meditation. Recreation Therapy Intern introduced the stress management technique of meditation. Recreation Therapy Intern played a mindful meditation video that focused on deep breathing and forgetting about their worries. Patients were to follow along at the video was played to engage in the activity.  Education:Stress Management, Discharge Planning.   Education Outcome:Acknowledges edcuation  Clinical Observations/Feedback:Pt did not attend group.  Aleeyah Bensen, Recreation Therapy Intern 

## 2016-10-25 NOTE — Progress Notes (Signed)
  [facility] :  Will you be returning to the same living situation after discharge:  Yes,  home At discharge, do you have transportation home?: Yes,  friend or family member Do you have the ability to pay for your medications: Yes,  mental health  Release of information consent forms completed and submitted to medical records by CSW.  Patient to Follow up at: Follow-up Information    Inc[facility] Follow up on 10/26/2016.   Why:  Office requested that patient walk in within 7 days of hospital discharge (Computer system down and they could not schedule appt). Walk in hours: Mon-Fri 8am-10am. Please bring: photo ID, social security card, and proof of household income. Thank you.  Contact information: [address] [place] [address] [phone]        [facility]              Follow up.   Why:  You are part of a new study that has not been launched yet. The office will call you when they get a start date for this. Thank you. Office Number: [phone].  Contact information: [address] [place] [phone]        Patient declined consent to send medical records to his current PCP.   Next level of care provider has access to [facility] Link:no  Safety Planning and Suicide Prevention discussed: Yes,  SPE completed with pt; pt contact attempts made to reach pt's grandmother. SPI pamphlet and Mobile Crisis information provided to pt along with grief counseling information  Have you used any form of tobacco in the last 30 days? (Cigarettes, Smokeless Tobacco, Cigars, and/or Pipes): Yes  Has patient been referred to the Quitline?: Patient refused referral  Patient has been referred for addiction treatment: Yes  Arion Morgan N Smart LCSW 10/25/2016, 8:33 AM

## 2016-10-25 NOTE — Progress Notes (Signed)
Patient ID: Ronnie ShearerBrandon Moran, male   DOB: 1985/09/03, 31 y.o.   MRN: [id number]  Discharge Note: Belongings returned to patient at time of discharge. Discharge instructions and medications were reviewed with patient. Patient verbalized understanding of both medications and discharge instructions. Patient discharged to lobby where his ride was waiting. Q15 minute safety checks maintained until time of discharge. No apparent distress upon discharge.

## 2016-10-25 NOTE — [facility] Suicide Risk Assessment (Addendum)
[facility] Discharge Suicide Risk Assessment   Principal Problem: Other stimulant abuse with stimulant-induced mood disorder [facility]) Discharge Diagnoses:  Patient Active Problem List   Diagnosis Date Noted  . 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 MDD (major depressive disorder), recurrent severe, without psychosis (HCC) [F33.2] 10/19/2016  . Chronic hepatitis C without hepatic coma (HCC) [B18.2] 09/08/2016    Total Time spent with patient: 30 minutes  Musculoskeletal: Strength & Muscle Tone: within normal limits Gait & Station: normal Patient leans: N/A  Psychiatric Specialty Exam: ROS no chest pain, no shortness of breath, no vomiting   Blood pressure 133/80, pulse 77, temperature 97.7 F (36.5 C), temperature source Oral, resp. rate 16, height 5\' 8"  (1.727 m), weight 73.5 kg (162 lb).Body mass index is 24.63 kg/m.  General Appearance: Well Groomed  Eye Contact::  Good  Speech:  Normal Rate409  Volume:  Normal  Mood:  improved mood and today appears euthymic  Affect:  more reactive, not irritable at this time  Thought Process:  Linear and Descriptions of Associations: Intact  Orientation:  Full (Time, Place, and Person)  Thought Content:  denies hallucinations, no delusions  Suicidal Thoughts:  No denies suicidal or self injurious ideations, denies homicidal or violent ideations  Homicidal Thoughts:  No  Memory:  recent and remote grossly intact   Judgement:  Other:  improving   Insight:  fair- improving   Psychomotor Activity:  Normal  Concentration:  Good  Recall:  Good  Fund of Knowledge:Good  Language: Good  Akathisia:  Negative  Handed:  Right  AIMS (if indicated):     Assets:  Desire for Improvement Resilience  Sleep:  Number of Hours: 6.5  Cognition: WNL  ADL's:  Intact   Mental Status Per Nursing Assessment::   On Admission:  NA  Demographic Factors:  31 year old single male, employed   Loss Factors: 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 States he had been sober for several months up to recent relapse .  Reports history of depression , anxiety , and states he has been on combination of Wellbutrin XL, Neurontin and Klonopin for years without side effects and with good response .  Risk Reduction Factors:   Employed and Positive coping skills or problem solving skills  Continued Clinical Symptoms:  At this time patient is improved compared to admission . He presents alert, attentive, better related, less irritable, with improved range of affect , no thought disorder, no suicidal or self injurious ideations, no hallucinations,no delusions, and is future oriented  Reports he is going back to work tomorrow. As noted, patient has reported being on Klonopin for a long period of time and has expressed reluctance to taper off at this time because it has been clinically helpful for him. We have reviewed addictive potential , drug drug interactions, and rationale to taper off potentially addictive medications. *At this time patient states he may not be able to get Klonopin renewed via his PCP until the end of this week. ( Of note, has given no consent to communicate with PCP at this time)  We discussed above, again reviewed rationale for tapering off ,and have recommended he consider tapering off . Have offered further inpatient stay to taper off , but at this time he is invested in discharge. Will provide short BZD taper on discharge and he is instructed to go to ED  if he develops any significant symptoms of WDL    Cognitive Features That Contribute To Risk:  No gross cognitive deficits noted upon discharge. Is alert , attentive, and oriented x 3   Suicide Risk:  Mild:  Suicidal ideation of limited frequency, intensity, duration, and specificity.  There are no identifiable plans, no  associated intent, mild dysphoria and related symptoms, good self-control (both objective and subjective assessment), few other risk factors, and identifiable protective factors, including available and accessible social support.  Follow-up Information    [facility] Follow up on 10/26/2016.   Why:  Office requested that patient walk in within 7 days of hospital discharge (Computer system down and they could not schedule appt). Walk in hours: Mon-Fri 8am-10am. Please bring: photo ID, social security card, and proof of household income. Thank you.  Contact information: [address] [place] [address] [phone]        [facility]              Follow up.   Why:  You are part of a new study that has not been launched yet. The office will call you when they get a start date for this. Thank you. Office Number: [phone].  Contact information: [address] [place] [phone]          Plan Of Care/Follow-up recommendations:  Activity:  as tolerated  Diet:  Regular Tests:  NA Other:  See below  Patient is requesting discharge , no current grounds for involuntary commitment . He is leaving in good spirits , states grandfather will pick him up later today Follow up as above, plans to continue treatment with PCP . Agrees to return to ED if worsening symptoms  Encouraged to avoid people, places and situations he associates with drug abuse, in order to minimize risk of relapse, and  Have encouraged regular NA participation , which he states he intends to do.   Craige CottaFernando A Cobos, MD 10/25/2016, 11:37 AM

## 2016-10-25 NOTE — Telephone Encounter (Signed)
Patient trying to get in touch with Selena BattenKim, left message asking for a return call. [phone], code [phone]

## 2016-10-25 NOTE — Progress Notes (Signed)
Patient ID: Ronnie ShearerBrandon Hancock-Lathrop, male   DOB: 1986/02/09, 31 y.o.   MRN: [id number]  DAR: Pt. Denies SI/HI and A/V Hallucinations. He reports sleep was poor last night, appetite is poor, energy level is normal, and concentration is good. He reports his depression level varies throughout the day, hopelessness is 4/10, and anxiety is 5/10. Patient does report pain in his ear and received medication, however reports no relief. Support and encouragement provided to the patient. Scheduled medications administered to patient per physician's orders. Patient is cooperative with staff at this time. He reports he feels ready for discharge. He can be sarcastic in interaction with Clinical research associatewriter at times but otherwise appropriate. He is seen in the milieu intermittently interacting with peers. Q15 minute checks are maintained for safety.

## 2016-10-26 ENCOUNTER — Telehealth: Payer: Self-pay | Admitting: *Deleted

## 2016-11-15 ENCOUNTER — Telehealth: Payer: Self-pay | Admitting: Infectious Disease

## 2016-11-15 NOTE — Telephone Encounter (Signed)
Patient called at 6:30 PM tonight concerned that he might miss the hepatitis C study. He told me that he believes that Selena BattenKim called him today and he is very anxious not to miss his opportunity to enroll in the study all pass this along to mildly nurse Selena BattenKim

## 2016-11-16 ENCOUNTER — Telehealth: Payer: Self-pay | Admitting: *Deleted

## 2016-11-16 NOTE — Telephone Encounter (Signed)
Called Ronnie Moran back this morning and let him know that I did not call him yesterday. The study he is waiting on to open is still not open. I did reassure him that we will call him as soon as it does. We are just waiting on team approval to start.

## 2017-01-10 DEATH — deceased

## 2018-05-29 IMAGING — DX DG CHEST 2V
2 series · 2 of 2 positions shown · non-contrast
Comparison: None.

CLINICAL DATA: Shortness of breath and nausea over the last day.
Smoking history.

EXAM:
CHEST  2 VIEW

[chest pa]
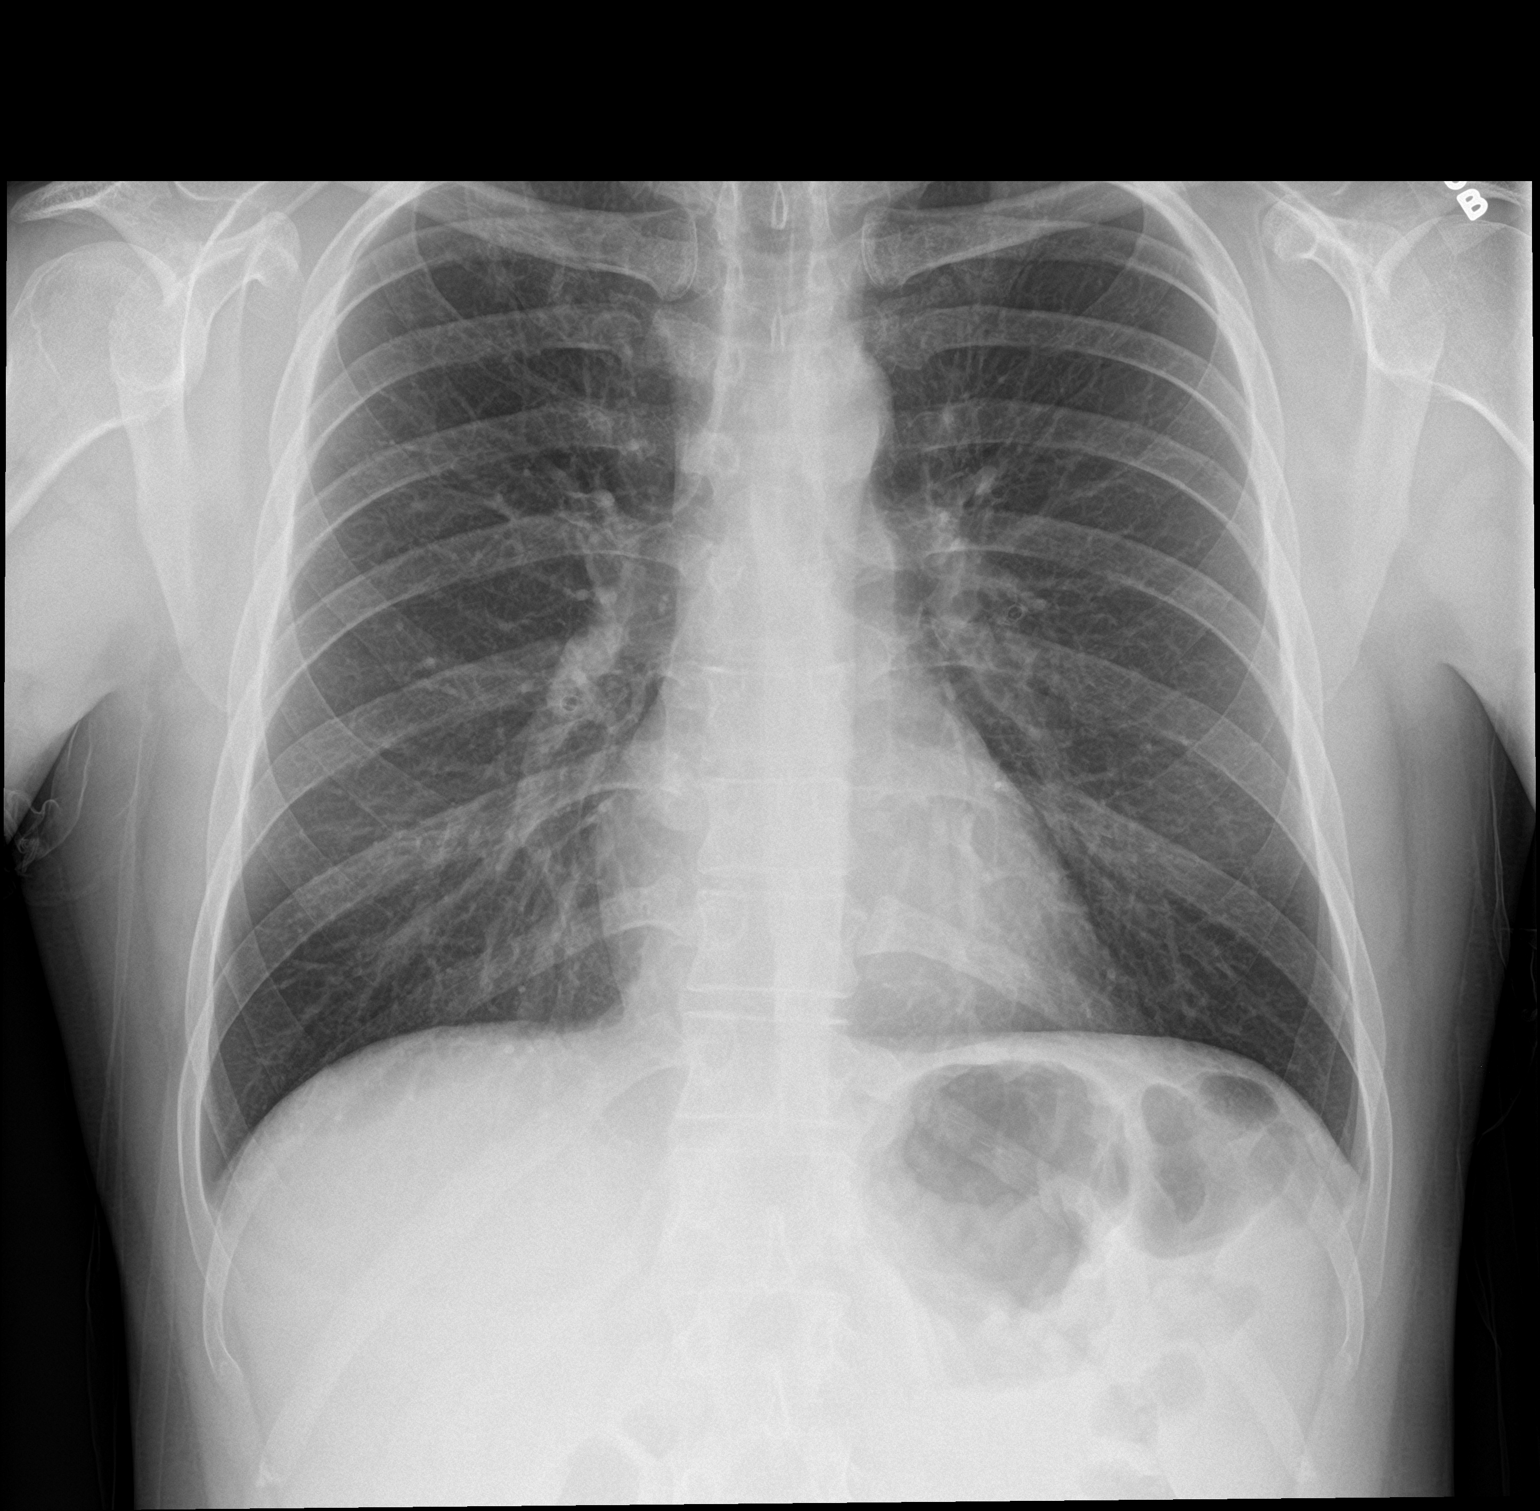

[chest lat]
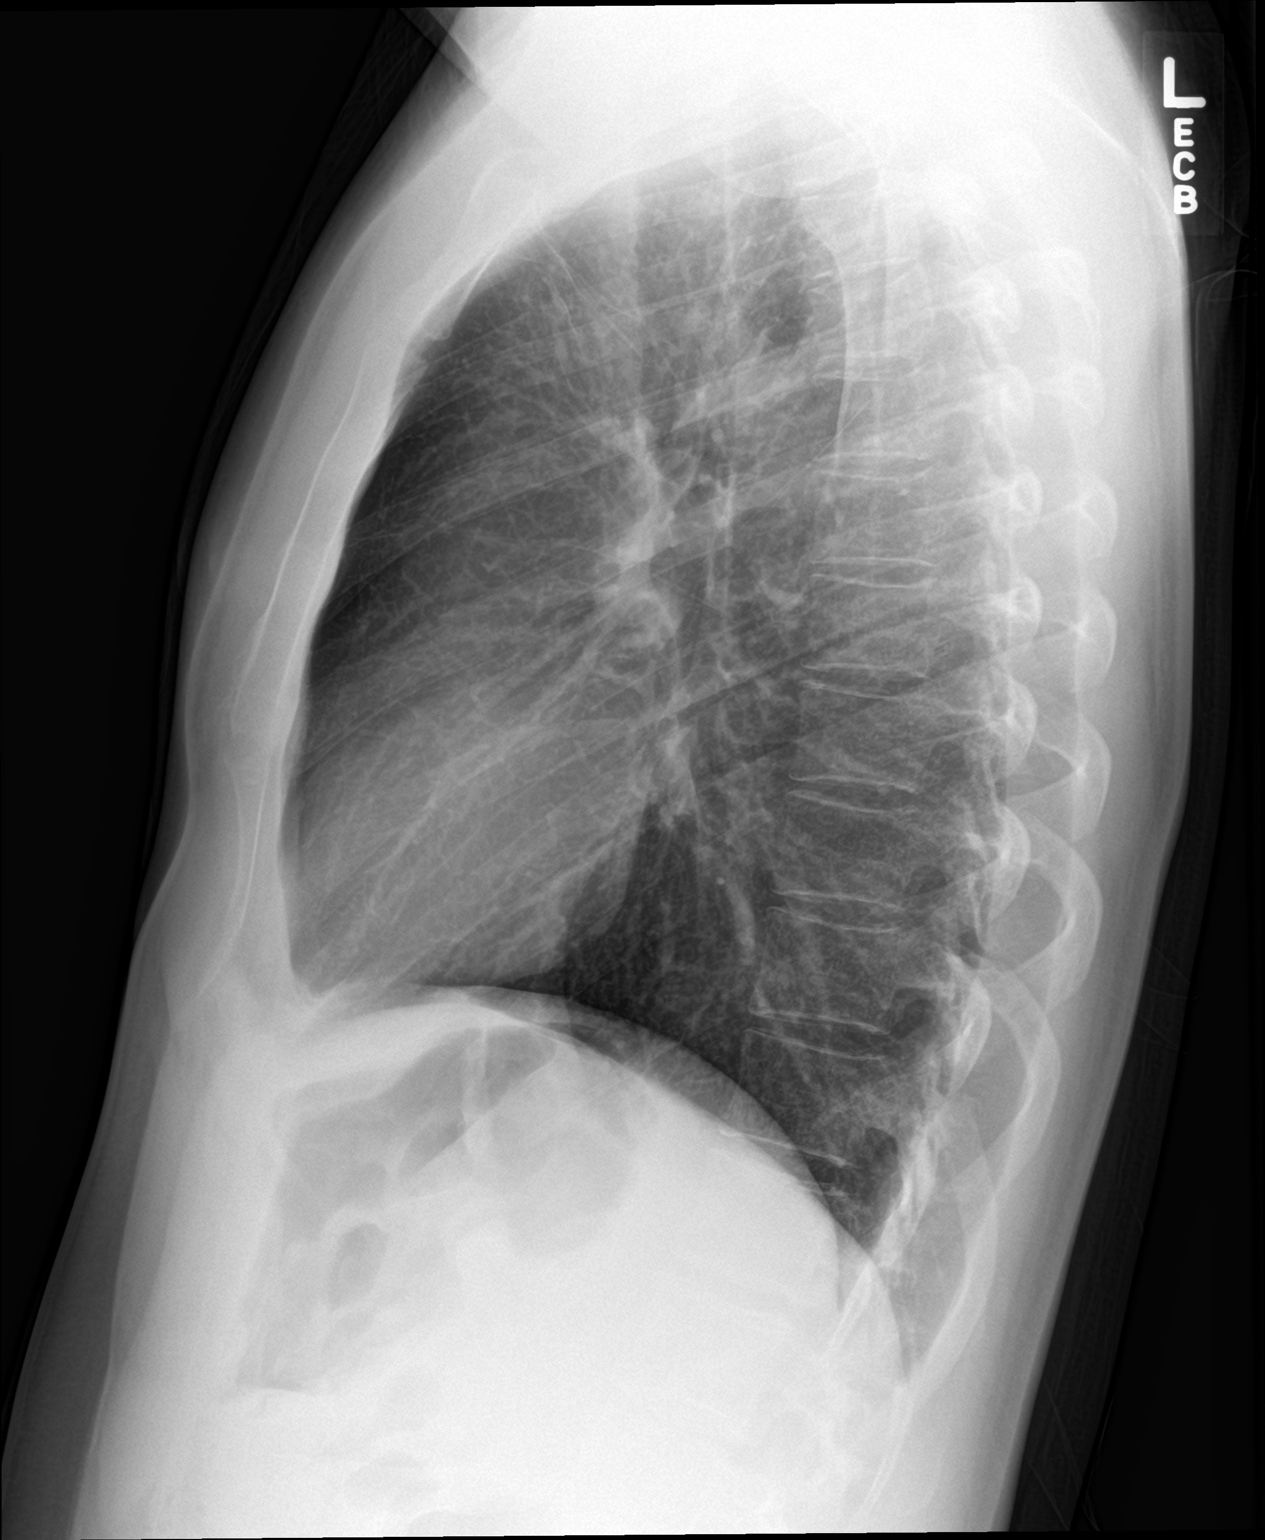

[2 of 2 positions shown; findings below may reference images not displayed]

FINDINGS: Heart size is normal. Mediastinal shadows are normal. The lungs are
clear except for mild linear scarring in the anterior right upper
lobe. No infiltrate, collapse or effusion. Bony structures are
unremarkable.
IMPRESSION: No active disease. Mild linear scarring in the anterior right upper
lobe.
